# Patient Record
Sex: Female | Born: 1950 | Race: White | Hispanic: No | Smoking: Never smoker
Health system: Southern US, Community
[De-identification: ages and names within clinical notes are randomized; demographics above are authoritative.]

## PROBLEM LIST (undated history)

## (undated) DIAGNOSIS — Z1211 Encounter for screening for malignant neoplasm of colon: Secondary | ICD-10-CM

## (undated) DIAGNOSIS — M810 Age-related osteoporosis without current pathological fracture: Secondary | ICD-10-CM

## (undated) DIAGNOSIS — Z87442 Personal history of urinary calculi: Secondary | ICD-10-CM

## (undated) DIAGNOSIS — I00 Rheumatic fever without heart involvement: Secondary | ICD-10-CM

## (undated) DIAGNOSIS — E7800 Pure hypercholesterolemia, unspecified: Secondary | ICD-10-CM

## (undated) DIAGNOSIS — R42 Dizziness and giddiness: Secondary | ICD-10-CM

## (undated) DIAGNOSIS — K219 Gastro-esophageal reflux disease without esophagitis: Secondary | ICD-10-CM

## (undated) DIAGNOSIS — K4090 Unilateral inguinal hernia, without obstruction or gangrene, not specified as recurrent: Secondary | ICD-10-CM

## (undated) HISTORY — DX: Dizziness and giddiness: R42

## (undated) HISTORY — DX: Encounter for screening for malignant neoplasm of colon: Z12.11

## (undated) HISTORY — DX: Unilateral inguinal hernia, without obstruction or gangrene, not specified as recurrent: K40.90

## (undated) HISTORY — DX: Rheumatic fever without heart involvement: I00

## (undated) HISTORY — PX: COLONOSCOPY: SHX174

## (undated) HISTORY — DX: Gastro-esophageal reflux disease without esophagitis: K21.9

## (undated) HISTORY — DX: Personal history of urinary calculi: Z87.442

## (undated) HISTORY — DX: Pure hypercholesterolemia, unspecified: E78.00

---
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
## 1979-04-22 HISTORY — PX: LAPAROSCOPY: SHX197

## 2016-11-19 DIAGNOSIS — K4090 Unilateral inguinal hernia, without obstruction or gangrene, not specified as recurrent: Secondary | ICD-10-CM

## 2016-11-19 HISTORY — DX: Unilateral inguinal hernia, without obstruction or gangrene, not specified as recurrent: K40.90

## 2016-12-16 ENCOUNTER — Ambulatory Visit (INDEPENDENT_AMBULATORY_CARE_PROVIDER_SITE_OTHER): Payer: BLUE CROSS/BLUE SHIELD | Admitting: Family Medicine

## 2016-12-16 ENCOUNTER — Encounter: Payer: Self-pay | Admitting: Family Medicine

## 2016-12-16 VITALS — BP 97/59 | HR 82 | Temp 98.3°F | Resp 16 | Ht 64.25 in | Wt 110.2 lb

## 2016-12-16 DIAGNOSIS — R196 Halitosis: Secondary | ICD-10-CM

## 2016-12-16 DIAGNOSIS — K4090 Unilateral inguinal hernia, without obstruction or gangrene, not specified as recurrent: Secondary | ICD-10-CM | POA: Diagnosis not present

## 2016-12-16 NOTE — Progress Notes (Signed)
Office Note 12/16/2016  CC:  Chief Complaint  Patient presents with  . Establish Care    Previous PCP: [facility] in [place]  . Hernia    LLQ?  [name] Bad Breath    HPI:  Katherine Villanueva is a 66 y.o. female who is here to establish care, has question of hernia, wants to discuss bad breath. Patient's most recent primary MD: [facility] in [place]. Old records were not reviewed prior to or during today's visit.  Takes multiple supplements: alfalfa for sinus issues.  cataplex GTF for carb digestion, risveratol, horse chestnut for leg circulation/varicose veins, Zypan for health digestion.  Chezyn for thyroid/immune.  Copper liver chelate--for low copper. MVI with iron.  Detoxadine for iodine supplement.  Fish oil.  Vit D.  She feels a lump on and off in LLQ.  No pain or discomfort.  She began to notice in last few months after lifting a lot when moving from [place].  BMs regular, no blood or melena.  Appetite is good.  No abnormal wt loss.  Some purposeful wt loss the last 1 yr.  She also c/o bad breath last 2-3 weeks mostly.  Her husband has noted it and told her to ask me about it. She denies any dental issues, denies feeling heartburn/gerd sx's lately.  As noted above, she is on quite a few otc herbal/vit remedies.  Past Medical History:  Diagnosis Date  . GERD (gastroesophageal reflux disease)   . Rheumatic fever     Past Surgical History:  Procedure Laterality Date  . LAPAROSCOPY  1981   "for possible infertility"  . TONSILLECTOMY  1960    Family History  Problem Relation Age of Onset  . Arthritis Mother   . Stroke Mother   . Kidney disease Mother   . Hyperlipidemia Father   . Heart disease Father   . Diabetes Father   . Stroke Brother   . Galactosemia Son   . Lymphoma Paternal Grandmother   . Diabetes Paternal Grandfather     Social History   Social History  . Marital status: Married    Spouse name: N/A  . Number of children: N/A  . Years of  education: N/A   Occupational History  . Not on file.   Social History Main Topics  . Smoking status: Never Smoker  . Smokeless tobacco: Never Used  . Alcohol use 2.4 oz/week    4 Glasses of wine per week  . Drug use: No  . Sexual activity: Not on file   Other Topics Concern  . Not on file   Social History Narrative   Married, 1 daughter and one son.   Educ: nursing school.   Occup: retired--hx of LPN work, sign Engineer, technical sales.  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   Moved from [place] 06/2016.   Alc: 4 glasses red wine per week.   No tobacco or drugs.    No Known Allergies  ROS Review of Systems  Constitutional: Negative for fatigue and fever.  HENT: Negative for congestion and sore throat.   Eyes: Negative for visual disturbance.  Respiratory: Negative for cough.   Cardiovascular: Negative for chest pain.  Gastrointestinal: Negative for abdominal pain and nausea.  Genitourinary: Negative for dysuria.  Musculoskeletal: Negative for back pain and joint swelling.  Skin: Negative for rash.  Neurological: Negative for weakness and headaches.  Hematological: Negative for adenopathy.    PE; Blood pressure (!) 97/59, pulse 82, temperature 98.3 F (36.8 C), temperature source Oral, resp.  rate 16, height 5' 4.25" (1.632 m), weight 110 lb 4 oz (50 kg), SpO2 99 %. Pt examined with Joellyn Haff, RN, as chaperone. Gen: Alert, well appearing.  Patient is oriented to person, place, time, and situation. AFFECT: pleasant, lucid thought and speech. AYT:KZSW: no injection, icteris, swelling, or exudate.  EOMI, PERRLA. Mouth: lips without lesion/swelling.  Oral mucosa pink and moist. Oropharynx without erythema, exudate, or swelling.  CV: RRR, soft systolic murmur, no r/g.   LUNGS: CTA bilat, nonlabored resps, good aeration in all lung fields. ABD: soft, NT/ND, +pulsatile abd aorta w/out bruit.  No mass or HSM.  BS normal.  L groin with small palpable nontender hernia, reducible.  Pertinent  labs:  none  ASSESSMENT AND PLAN:   New pt; will try to obtain prior PCP records.  1) Left inguinal hernia: small and asymptomatic. Reassurance given. Watchful waiting approach at this time. Pt in agreement with this plan. Signs/symptoms to call or return for were reviewed and pt expressed understanding.  2) Halitosis: unclear etiology---relative dehydration vs silent GER vs effect from all her herbal/vit supplements. Discussed strategies to try to see which one of these it may be.  An After Visit Summary was printed and given to the patient.  Return in about 3 months (around 03/18/2017) for annual CPE (fasting).  Signed:  Santiago Bumpers, MD           12/16/2016

## 2016-12-24 DIAGNOSIS — M9901 Segmental and somatic dysfunction of cervical region: Secondary | ICD-10-CM | POA: Diagnosis not present

## 2016-12-24 DIAGNOSIS — M9903 Segmental and somatic dysfunction of lumbar region: Secondary | ICD-10-CM | POA: Diagnosis not present

## 2016-12-24 DIAGNOSIS — M9904 Segmental and somatic dysfunction of sacral region: Secondary | ICD-10-CM | POA: Diagnosis not present

## 2016-12-24 DIAGNOSIS — M9902 Segmental and somatic dysfunction of thoracic region: Secondary | ICD-10-CM | POA: Diagnosis not present

## 2016-12-30 DIAGNOSIS — M9902 Segmental and somatic dysfunction of thoracic region: Secondary | ICD-10-CM | POA: Diagnosis not present

## 2016-12-30 DIAGNOSIS — M9901 Segmental and somatic dysfunction of cervical region: Secondary | ICD-10-CM | POA: Diagnosis not present

## 2016-12-30 DIAGNOSIS — M9904 Segmental and somatic dysfunction of sacral region: Secondary | ICD-10-CM | POA: Diagnosis not present

## 2016-12-30 DIAGNOSIS — M9903 Segmental and somatic dysfunction of lumbar region: Secondary | ICD-10-CM | POA: Diagnosis not present

## 2017-01-01 DIAGNOSIS — M9901 Segmental and somatic dysfunction of cervical region: Secondary | ICD-10-CM | POA: Diagnosis not present

## 2017-01-01 DIAGNOSIS — M9904 Segmental and somatic dysfunction of sacral region: Secondary | ICD-10-CM | POA: Diagnosis not present

## 2017-01-01 DIAGNOSIS — M9902 Segmental and somatic dysfunction of thoracic region: Secondary | ICD-10-CM | POA: Diagnosis not present

## 2017-01-01 DIAGNOSIS — M9903 Segmental and somatic dysfunction of lumbar region: Secondary | ICD-10-CM | POA: Diagnosis not present

## 2017-01-06 DIAGNOSIS — M9902 Segmental and somatic dysfunction of thoracic region: Secondary | ICD-10-CM | POA: Diagnosis not present

## 2017-01-06 DIAGNOSIS — M9903 Segmental and somatic dysfunction of lumbar region: Secondary | ICD-10-CM | POA: Diagnosis not present

## 2017-01-06 DIAGNOSIS — M9904 Segmental and somatic dysfunction of sacral region: Secondary | ICD-10-CM | POA: Diagnosis not present

## 2017-01-06 DIAGNOSIS — M9901 Segmental and somatic dysfunction of cervical region: Secondary | ICD-10-CM | POA: Diagnosis not present

## 2017-01-21 DIAGNOSIS — M9903 Segmental and somatic dysfunction of lumbar region: Secondary | ICD-10-CM | POA: Diagnosis not present

## 2017-01-21 DIAGNOSIS — M9901 Segmental and somatic dysfunction of cervical region: Secondary | ICD-10-CM | POA: Diagnosis not present

## 2017-01-21 DIAGNOSIS — M9904 Segmental and somatic dysfunction of sacral region: Secondary | ICD-10-CM | POA: Diagnosis not present

## 2017-01-21 DIAGNOSIS — M9902 Segmental and somatic dysfunction of thoracic region: Secondary | ICD-10-CM | POA: Diagnosis not present

## 2017-01-28 DIAGNOSIS — M9902 Segmental and somatic dysfunction of thoracic region: Secondary | ICD-10-CM | POA: Diagnosis not present

## 2017-01-28 DIAGNOSIS — M9901 Segmental and somatic dysfunction of cervical region: Secondary | ICD-10-CM | POA: Diagnosis not present

## 2017-01-28 DIAGNOSIS — M9903 Segmental and somatic dysfunction of lumbar region: Secondary | ICD-10-CM | POA: Diagnosis not present

## 2017-01-28 DIAGNOSIS — M9904 Segmental and somatic dysfunction of sacral region: Secondary | ICD-10-CM | POA: Diagnosis not present

## 2017-02-04 DIAGNOSIS — M9903 Segmental and somatic dysfunction of lumbar region: Secondary | ICD-10-CM | POA: Diagnosis not present

## 2017-02-04 DIAGNOSIS — M9902 Segmental and somatic dysfunction of thoracic region: Secondary | ICD-10-CM | POA: Diagnosis not present

## 2017-02-04 DIAGNOSIS — M9901 Segmental and somatic dysfunction of cervical region: Secondary | ICD-10-CM | POA: Diagnosis not present

## 2017-02-04 DIAGNOSIS — M9904 Segmental and somatic dysfunction of sacral region: Secondary | ICD-10-CM | POA: Diagnosis not present

## 2017-02-11 DIAGNOSIS — M9904 Segmental and somatic dysfunction of sacral region: Secondary | ICD-10-CM | POA: Diagnosis not present

## 2017-02-11 DIAGNOSIS — M9902 Segmental and somatic dysfunction of thoracic region: Secondary | ICD-10-CM | POA: Diagnosis not present

## 2017-02-11 DIAGNOSIS — M9903 Segmental and somatic dysfunction of lumbar region: Secondary | ICD-10-CM | POA: Diagnosis not present

## 2017-02-11 DIAGNOSIS — M9901 Segmental and somatic dysfunction of cervical region: Secondary | ICD-10-CM | POA: Diagnosis not present

## 2017-02-18 DIAGNOSIS — M9903 Segmental and somatic dysfunction of lumbar region: Secondary | ICD-10-CM | POA: Diagnosis not present

## 2017-02-18 DIAGNOSIS — M9901 Segmental and somatic dysfunction of cervical region: Secondary | ICD-10-CM | POA: Diagnosis not present

## 2017-02-18 DIAGNOSIS — M9904 Segmental and somatic dysfunction of sacral region: Secondary | ICD-10-CM | POA: Diagnosis not present

## 2017-02-18 DIAGNOSIS — M9902 Segmental and somatic dysfunction of thoracic region: Secondary | ICD-10-CM | POA: Diagnosis not present

## 2017-02-19 DIAGNOSIS — E7800 Pure hypercholesterolemia, unspecified: Secondary | ICD-10-CM

## 2017-02-19 HISTORY — DX: Pure hypercholesterolemia, unspecified: E78.00

## 2017-03-04 DIAGNOSIS — M9903 Segmental and somatic dysfunction of lumbar region: Secondary | ICD-10-CM | POA: Diagnosis not present

## 2017-03-04 DIAGNOSIS — M9904 Segmental and somatic dysfunction of sacral region: Secondary | ICD-10-CM | POA: Diagnosis not present

## 2017-03-04 DIAGNOSIS — M9902 Segmental and somatic dysfunction of thoracic region: Secondary | ICD-10-CM | POA: Diagnosis not present

## 2017-03-04 DIAGNOSIS — M9901 Segmental and somatic dysfunction of cervical region: Secondary | ICD-10-CM | POA: Diagnosis not present

## 2017-03-18 ENCOUNTER — Encounter: Payer: Self-pay | Admitting: Family Medicine

## 2017-03-18 ENCOUNTER — Other Ambulatory Visit: Payer: Self-pay

## 2017-03-18 ENCOUNTER — Ambulatory Visit (INDEPENDENT_AMBULATORY_CARE_PROVIDER_SITE_OTHER): Payer: BLUE CROSS/BLUE SHIELD | Admitting: Family Medicine

## 2017-03-18 ENCOUNTER — Other Ambulatory Visit (HOSPITAL_COMMUNITY)
Admission: RE | Admit: 2017-03-18 | Discharge: 2017-03-18 | Disposition: A | Discharge status: Home / Self Care | Payer: BLUE CROSS/BLUE SHIELD | Source: Ambulatory Visit | Attending: Family Medicine | Admitting: Family Medicine

## 2017-03-18 VITALS — BP 106/57 | HR 70 | Temp 98.1°F | Resp 16 | Ht 64.25 in | Wt 111.2 lb

## 2017-03-18 DIAGNOSIS — M9901 Segmental and somatic dysfunction of cervical region: Secondary | ICD-10-CM | POA: Diagnosis not present

## 2017-03-18 DIAGNOSIS — M9903 Segmental and somatic dysfunction of lumbar region: Secondary | ICD-10-CM | POA: Diagnosis not present

## 2017-03-18 DIAGNOSIS — Z1239 Encounter for other screening for malignant neoplasm of breast: Secondary | ICD-10-CM

## 2017-03-18 DIAGNOSIS — Z0000 Encounter for general adult medical examination without abnormal findings: Secondary | ICD-10-CM

## 2017-03-18 DIAGNOSIS — M9902 Segmental and somatic dysfunction of thoracic region: Secondary | ICD-10-CM | POA: Diagnosis not present

## 2017-03-18 DIAGNOSIS — Z124 Encounter for screening for malignant neoplasm of cervix: Secondary | ICD-10-CM

## 2017-03-18 DIAGNOSIS — Z1231 Encounter for screening mammogram for malignant neoplasm of breast: Secondary | ICD-10-CM

## 2017-03-18 DIAGNOSIS — M9904 Segmental and somatic dysfunction of sacral region: Secondary | ICD-10-CM | POA: Diagnosis not present

## 2017-03-18 LAB — CBC WITH DIFFERENTIAL/PLATELET
Basophils Absolute: 0.1 10*3/uL (ref 0.0–0.1)
Basophils Relative: 0.8 % (ref 0.0–3.0)
Eosinophils Absolute: 0.1 10*3/uL (ref 0.0–0.7)
Eosinophils Relative: 1 % (ref 0.0–5.0)
HCT: 41 % (ref 36.0–46.0)
Hemoglobin: 13.5 g/dL (ref 12.0–15.0)
Lymphocytes Relative: 31.1 % (ref 12.0–46.0)
Lymphs Abs: 2.3 10*3/uL (ref 0.7–4.0)
MCHC: 33.1 g/dL (ref 30.0–36.0)
MCV: 97.2 fl (ref 78.0–100.0)
Monocytes Absolute: 0.6 10*3/uL (ref 0.1–1.0)
Monocytes Relative: 7.4 % (ref 3.0–12.0)
Neutro Abs: 4.5 10*3/uL (ref 1.4–7.7)
Neutrophils Relative %: 59.7 % (ref 43.0–77.0)
Platelets: 273 10*3/uL (ref 150.0–400.0)
RBC: 4.21 Mil/uL (ref 3.87–5.11)
RDW: 13.7 % (ref 11.5–15.5)
WBC: 7.5 10*3/uL (ref 4.0–10.5)

## 2017-03-18 LAB — COMPREHENSIVE METABOLIC PANEL
ALT: 29 U/L (ref 0–35)
AST: 26 U/L (ref 0–37)
Albumin: 4.5 g/dL (ref 3.5–5.2)
Alkaline Phosphatase: 58 U/L (ref 39–117)
BUN: 19 mg/dL (ref 6–23)
CO2: 30 mEq/L (ref 19–32)
Calcium: 9.5 mg/dL (ref 8.4–10.5)
Chloride: 103 mEq/L (ref 96–112)
Creatinine, Ser: 0.67 mg/dL (ref 0.40–1.20)
GFR: 93.94 mL/min (ref >60.00)
Glucose, Bld: 96 mg/dL (ref 70–99)
Potassium: 4 mEq/L (ref 3.5–5.1)
Sodium: 140 mEq/L (ref 135–145)
Total Bilirubin: 1 mg/dL (ref 0.2–1.2)
Total Protein: 6.9 g/dL (ref 6.0–8.3)

## 2017-03-18 LAB — LIPID PANEL
Cholesterol: 318 mg/dL — ABNORMAL HIGH (ref 0–200)
HDL: 76.6 mg/dL (ref >39.00)
LDL Cholesterol: 232 mg/dL — ABNORMAL HIGH (ref 0–99)
NonHDL: 240.91
Total CHOL/HDL Ratio: 4
Triglycerides: 47 mg/dL (ref 0.0–149.0)
VLDL: 9.4 mg/dL (ref 0.0–40.0)

## 2017-03-18 LAB — TSH: TSH: 3.18 u[IU]/mL (ref 0.35–4.50)

## 2017-03-18 NOTE — Patient Instructions (Signed)

## 2017-03-18 NOTE — Progress Notes (Signed)
Office Note 03/18/2017  CC:  Chief Complaint  Patient presents with  . Annual Exam    Pt is fasting.    HPI:  Katherine Villanueva is a 66 y.o. female who is here for annual health maintenance exam. Pt pretty recently moved here from [place] and although we've requested old PCP records we have not received any.  Most recent pap: "quite some time ago".  No hx of abnormals.  She is postmenopausal. Mammogram: "quite some time ago".  Hx of cysts (20 yrs ago or so), frequent f/u in the past, then resumed annual mammos.  Past Medical History:  Diagnosis Date  . GERD (gastroesophageal reflux disease)   . Left inguinal hernia 11/2016   Watchful waiting approach  . Rheumatic fever     Past Surgical History:  Procedure Laterality Date  . COLONOSCOPY     X 2.  Most recent ? 20014?  No abnormals.  [name]. LAPAROSCOPY  1981   "for possible infertility"  . TONSILLECTOMY  1960    Family History  Problem Relation Age of Onset  . Arthritis Mother   . Stroke Mother   . Kidney disease Mother   . Hyperlipidemia Father   . Heart disease Father   . Diabetes Father   . Stroke Brother   . Galactosemia Son   . Lymphoma Paternal Grandmother   . Diabetes Paternal Grandfather     Social History   Socioeconomic History  . Marital status: Married    Spouse name: Not on file  . Number of children: Not on file  . Years of education: Not on file  . Highest education level: Not on file  Social Needs  . Financial resource strain: Not on file  . Food insecurity - worry: Not on file  . Food insecurity - inability: Not on file  . Transportation needs - medical: Not on file  . Transportation needs - non-medical: Not on file  Occupational History  . Not on file  Tobacco Use  . Smoking status: Never Smoker  . Smokeless tobacco: Never Used  Substance and Sexual Activity  . Alcohol use: Yes    Alcohol/week: 2.4 oz    Types: 4 Glasses of wine per week  . Drug use: No  . Sexual activity: Not on  file  Other Topics Concern  . Not on file  Social History Narrative   Married, 1 daughter and one son.   Educ: nursing school.   Occup: retired--hx of LPN work, sign Engineer, technical saleslanguage interpreter.  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   Moved from [place] 06/2016.   Alc: 4 glasses red wine per week.   No tobacco or drugs.    No outpatient medications prior to visit.   No facility-administered medications prior to visit.     No Known Allergies  ROS Review of Systems  Constitutional: Negative for appetite change, chills, fatigue and fever.  HENT: Negative for congestion, dental problem, ear pain and sore throat.   Eyes: Negative for discharge, redness and visual disturbance.  Respiratory: Negative for cough, chest tightness, shortness of breath and wheezing.   Cardiovascular: Negative for chest pain, palpitations and leg swelling.  Gastrointestinal: Negative for abdominal pain, blood in stool, diarrhea, nausea and vomiting.  Genitourinary: Negative for difficulty urinating, dysuria, flank pain, frequency, hematuria and urgency.  Musculoskeletal: Negative for arthralgias, back pain, joint swelling, myalgias and neck stiffness.  Skin: Negative for pallor and rash.  Neurological: Negative for dizziness, speech difficulty, weakness and headaches.  Hematological: Negative for adenopathy.  Does not bruise/bleed easily.  Psychiatric/Behavioral: Negative for confusion and sleep disturbance. The patient is not nervous/anxious.     PE; Blood pressure (!) 106/57, pulse 70, temperature 98.1 F (36.7 C), temperature source Oral, resp. rate 16, height 5' 4.25" (1.632 m), weight 111 lb 4 oz (50.5 kg), SpO2 100 %. Body mass index is 18.95 kg/m.  Examination chaperoned by Pryor OchoaHeather Sutherland, CMA.  Gen: Alert, well appearing.  Patient is oriented to person, place, time, and situation. AFFECT: pleasant, lucid thought and speech. ENT: Ears: EACs clear, normal epithelium.  TMs with good light reflex and landmarks  bilaterally.  Eyes: no injection, icteris, swelling, or exudate.  EOMI, PERRLA. Nose: no drainage or turbinate edema/swelling.  No injection or focal lesion.  Mouth: lips without lesion/swelling.  Oral mucosa pink and moist.  Dentition intact and without obvious caries or gingival swelling.  Oropharynx without erythema, exudate, or swelling.  Neck: supple/nontender.  No LAD, mass, or TM.  Carotid pulses 2+ bilaterally, without bruits. CV: RRR, no m/r/g.   LUNGS: CTA bilat, nonlabored resps, good aeration in all lung fields. ABD: soft, NT, ND, BS normal.  No hepatospenomegaly or mass.  No bruits.  Abdomen is flat, I can feel abd aortic pulsations readily but no mass/tenderness/bruit. EXT: no clubbing, cyanosis, or edema.  Musculoskeletal: no joint swelling, erythema, warmth, or tenderness.  ROM of all joints intact. Skin - no sores or suspicious lesions or rashes or color changes Vagina and vulva are normal;  no discharge is noted.  Cervix normal without lesions. Uterus anteverted and mobile, normal in size and shape without tenderness.  Adnexa normal in size without masses or tenderness. Pap Smear - is completed today. Exam chaperoned by female assistant.   Pertinent labs:  none  ASSESSMENT AND PLAN:   Health maintenance exam: Reviewed age and gender appropriate health maintenance issues (prudent diet, regular exercise, health risks of tobacco and excessive alcohol, use of seatbelts, fire alarms in home, use of sunscreen).  Also reviewed age and gender appropriate health screening as well as vaccine recommendations. Vaccines: she declined Tdap and flu vaccines today. Labs: fasting HP today.  She declined HIV.  She'll decline Hep C for now while she checks on coverage with her insurer. Breast ca screening: mammogram ordered today. Cervical Ca screening: pap done today.  Pelvic normal. Colon ca screening: repeat approx 2024--will clarify when old records obtained.  An After Visit Summary was  printed and given to the patient.  FOLLOW UP:  Return in about 1 year (around 03/18/2018) for annual CPE (fasting).  Signed:  Santiago BumpersPhil Dimitri Shakespeare, MD           03/18/2017

## 2017-03-19 ENCOUNTER — Other Ambulatory Visit: Payer: Self-pay | Admitting: *Deleted

## 2017-03-19 DIAGNOSIS — E7800 Pure hypercholesterolemia, unspecified: Secondary | ICD-10-CM

## 2017-03-20 LAB — CYTOLOGY - PAP
Diagnosis: NEGATIVE
HPV: NOT DETECTED

## 2017-03-23 DIAGNOSIS — M9903 Segmental and somatic dysfunction of lumbar region: Secondary | ICD-10-CM | POA: Diagnosis not present

## 2017-03-23 DIAGNOSIS — M9901 Segmental and somatic dysfunction of cervical region: Secondary | ICD-10-CM | POA: Diagnosis not present

## 2017-03-23 DIAGNOSIS — M9902 Segmental and somatic dysfunction of thoracic region: Secondary | ICD-10-CM | POA: Diagnosis not present

## 2017-03-23 DIAGNOSIS — M9904 Segmental and somatic dysfunction of sacral region: Secondary | ICD-10-CM | POA: Diagnosis not present

## 2017-03-26 DIAGNOSIS — M9901 Segmental and somatic dysfunction of cervical region: Secondary | ICD-10-CM | POA: Diagnosis not present

## 2017-03-26 DIAGNOSIS — M9904 Segmental and somatic dysfunction of sacral region: Secondary | ICD-10-CM | POA: Diagnosis not present

## 2017-03-26 DIAGNOSIS — M9902 Segmental and somatic dysfunction of thoracic region: Secondary | ICD-10-CM | POA: Diagnosis not present

## 2017-03-26 DIAGNOSIS — M9903 Segmental and somatic dysfunction of lumbar region: Secondary | ICD-10-CM | POA: Diagnosis not present

## 2017-04-01 DIAGNOSIS — M9902 Segmental and somatic dysfunction of thoracic region: Secondary | ICD-10-CM | POA: Diagnosis not present

## 2017-04-01 DIAGNOSIS — M9903 Segmental and somatic dysfunction of lumbar region: Secondary | ICD-10-CM | POA: Diagnosis not present

## 2017-04-01 DIAGNOSIS — M9904 Segmental and somatic dysfunction of sacral region: Secondary | ICD-10-CM | POA: Diagnosis not present

## 2017-04-01 DIAGNOSIS — M9901 Segmental and somatic dysfunction of cervical region: Secondary | ICD-10-CM | POA: Diagnosis not present

## 2017-04-06 DIAGNOSIS — M9904 Segmental and somatic dysfunction of sacral region: Secondary | ICD-10-CM | POA: Diagnosis not present

## 2017-04-06 DIAGNOSIS — M9903 Segmental and somatic dysfunction of lumbar region: Secondary | ICD-10-CM | POA: Diagnosis not present

## 2017-04-06 DIAGNOSIS — M9901 Segmental and somatic dysfunction of cervical region: Secondary | ICD-10-CM | POA: Diagnosis not present

## 2017-04-06 DIAGNOSIS — M9902 Segmental and somatic dysfunction of thoracic region: Secondary | ICD-10-CM | POA: Diagnosis not present

## 2017-04-08 DIAGNOSIS — M9904 Segmental and somatic dysfunction of sacral region: Secondary | ICD-10-CM | POA: Diagnosis not present

## 2017-04-08 DIAGNOSIS — M9903 Segmental and somatic dysfunction of lumbar region: Secondary | ICD-10-CM | POA: Diagnosis not present

## 2017-04-08 DIAGNOSIS — M9902 Segmental and somatic dysfunction of thoracic region: Secondary | ICD-10-CM | POA: Diagnosis not present

## 2017-04-08 DIAGNOSIS — M9901 Segmental and somatic dysfunction of cervical region: Secondary | ICD-10-CM | POA: Diagnosis not present

## 2017-04-22 DIAGNOSIS — M9903 Segmental and somatic dysfunction of lumbar region: Secondary | ICD-10-CM | POA: Diagnosis not present

## 2017-04-22 DIAGNOSIS — M9902 Segmental and somatic dysfunction of thoracic region: Secondary | ICD-10-CM | POA: Diagnosis not present

## 2017-04-22 DIAGNOSIS — M9904 Segmental and somatic dysfunction of sacral region: Secondary | ICD-10-CM | POA: Diagnosis not present

## 2017-04-22 DIAGNOSIS — M9901 Segmental and somatic dysfunction of cervical region: Secondary | ICD-10-CM | POA: Diagnosis not present

## 2017-04-27 ENCOUNTER — Ambulatory Visit: Payer: BLUE CROSS/BLUE SHIELD

## 2017-04-27 DIAGNOSIS — M9902 Segmental and somatic dysfunction of thoracic region: Secondary | ICD-10-CM | POA: Diagnosis not present

## 2017-04-27 DIAGNOSIS — M9901 Segmental and somatic dysfunction of cervical region: Secondary | ICD-10-CM | POA: Diagnosis not present

## 2017-04-27 DIAGNOSIS — M9903 Segmental and somatic dysfunction of lumbar region: Secondary | ICD-10-CM | POA: Diagnosis not present

## 2017-04-27 DIAGNOSIS — M9904 Segmental and somatic dysfunction of sacral region: Secondary | ICD-10-CM | POA: Diagnosis not present

## 2017-04-28 ENCOUNTER — Other Ambulatory Visit: Payer: BLUE CROSS/BLUE SHIELD

## 2017-04-29 DIAGNOSIS — M9903 Segmental and somatic dysfunction of lumbar region: Secondary | ICD-10-CM | POA: Diagnosis not present

## 2017-04-29 DIAGNOSIS — M9904 Segmental and somatic dysfunction of sacral region: Secondary | ICD-10-CM | POA: Diagnosis not present

## 2017-04-29 DIAGNOSIS — M9901 Segmental and somatic dysfunction of cervical region: Secondary | ICD-10-CM | POA: Diagnosis not present

## 2017-04-29 DIAGNOSIS — M9902 Segmental and somatic dysfunction of thoracic region: Secondary | ICD-10-CM | POA: Diagnosis not present

## 2017-05-04 DIAGNOSIS — M9904 Segmental and somatic dysfunction of sacral region: Secondary | ICD-10-CM | POA: Diagnosis not present

## 2017-05-04 DIAGNOSIS — M9901 Segmental and somatic dysfunction of cervical region: Secondary | ICD-10-CM | POA: Diagnosis not present

## 2017-05-04 DIAGNOSIS — M9903 Segmental and somatic dysfunction of lumbar region: Secondary | ICD-10-CM | POA: Diagnosis not present

## 2017-05-04 DIAGNOSIS — M9902 Segmental and somatic dysfunction of thoracic region: Secondary | ICD-10-CM | POA: Diagnosis not present

## 2017-05-07 DIAGNOSIS — M9904 Segmental and somatic dysfunction of sacral region: Secondary | ICD-10-CM | POA: Diagnosis not present

## 2017-05-07 DIAGNOSIS — M9903 Segmental and somatic dysfunction of lumbar region: Secondary | ICD-10-CM | POA: Diagnosis not present

## 2017-05-07 DIAGNOSIS — M9901 Segmental and somatic dysfunction of cervical region: Secondary | ICD-10-CM | POA: Diagnosis not present

## 2017-05-07 DIAGNOSIS — M9902 Segmental and somatic dysfunction of thoracic region: Secondary | ICD-10-CM | POA: Diagnosis not present

## 2017-05-11 DIAGNOSIS — M9901 Segmental and somatic dysfunction of cervical region: Secondary | ICD-10-CM | POA: Diagnosis not present

## 2017-05-11 DIAGNOSIS — M9902 Segmental and somatic dysfunction of thoracic region: Secondary | ICD-10-CM | POA: Diagnosis not present

## 2017-05-11 DIAGNOSIS — M9903 Segmental and somatic dysfunction of lumbar region: Secondary | ICD-10-CM | POA: Diagnosis not present

## 2017-05-11 DIAGNOSIS — M9904 Segmental and somatic dysfunction of sacral region: Secondary | ICD-10-CM | POA: Diagnosis not present

## 2017-05-14 DIAGNOSIS — M9904 Segmental and somatic dysfunction of sacral region: Secondary | ICD-10-CM | POA: Diagnosis not present

## 2017-05-14 DIAGNOSIS — M9902 Segmental and somatic dysfunction of thoracic region: Secondary | ICD-10-CM | POA: Diagnosis not present

## 2017-05-14 DIAGNOSIS — M9903 Segmental and somatic dysfunction of lumbar region: Secondary | ICD-10-CM | POA: Diagnosis not present

## 2017-05-14 DIAGNOSIS — M9901 Segmental and somatic dysfunction of cervical region: Secondary | ICD-10-CM | POA: Diagnosis not present

## 2017-05-19 DIAGNOSIS — M9904 Segmental and somatic dysfunction of sacral region: Secondary | ICD-10-CM | POA: Diagnosis not present

## 2017-05-19 DIAGNOSIS — M9903 Segmental and somatic dysfunction of lumbar region: Secondary | ICD-10-CM | POA: Diagnosis not present

## 2017-05-19 DIAGNOSIS — M9901 Segmental and somatic dysfunction of cervical region: Secondary | ICD-10-CM | POA: Diagnosis not present

## 2017-05-19 DIAGNOSIS — M9902 Segmental and somatic dysfunction of thoracic region: Secondary | ICD-10-CM | POA: Diagnosis not present

## 2017-05-21 DIAGNOSIS — M9904 Segmental and somatic dysfunction of sacral region: Secondary | ICD-10-CM | POA: Diagnosis not present

## 2017-05-21 DIAGNOSIS — M9902 Segmental and somatic dysfunction of thoracic region: Secondary | ICD-10-CM | POA: Diagnosis not present

## 2017-05-21 DIAGNOSIS — M9901 Segmental and somatic dysfunction of cervical region: Secondary | ICD-10-CM | POA: Diagnosis not present

## 2017-05-21 DIAGNOSIS — M9903 Segmental and somatic dysfunction of lumbar region: Secondary | ICD-10-CM | POA: Diagnosis not present

## 2017-05-26 DIAGNOSIS — M9901 Segmental and somatic dysfunction of cervical region: Secondary | ICD-10-CM | POA: Diagnosis not present

## 2017-05-26 DIAGNOSIS — M9904 Segmental and somatic dysfunction of sacral region: Secondary | ICD-10-CM | POA: Diagnosis not present

## 2017-05-26 DIAGNOSIS — M9903 Segmental and somatic dysfunction of lumbar region: Secondary | ICD-10-CM | POA: Diagnosis not present

## 2017-05-26 DIAGNOSIS — M9902 Segmental and somatic dysfunction of thoracic region: Secondary | ICD-10-CM | POA: Diagnosis not present

## 2017-05-28 DIAGNOSIS — M9901 Segmental and somatic dysfunction of cervical region: Secondary | ICD-10-CM | POA: Diagnosis not present

## 2017-05-28 DIAGNOSIS — M9902 Segmental and somatic dysfunction of thoracic region: Secondary | ICD-10-CM | POA: Diagnosis not present

## 2017-05-28 DIAGNOSIS — M9904 Segmental and somatic dysfunction of sacral region: Secondary | ICD-10-CM | POA: Diagnosis not present

## 2017-05-28 DIAGNOSIS — M9903 Segmental and somatic dysfunction of lumbar region: Secondary | ICD-10-CM | POA: Diagnosis not present

## 2017-05-29 ENCOUNTER — Other Ambulatory Visit (INDEPENDENT_AMBULATORY_CARE_PROVIDER_SITE_OTHER): Payer: BLUE CROSS/BLUE SHIELD

## 2017-05-29 DIAGNOSIS — E7800 Pure hypercholesterolemia, unspecified: Secondary | ICD-10-CM | POA: Diagnosis not present

## 2017-05-29 LAB — LIPID PANEL
Cholesterol: 211 mg/dL — ABNORMAL HIGH (ref 0–200)
HDL: 71.6 mg/dL (ref >39.00)
LDL Cholesterol: 130 mg/dL — ABNORMAL HIGH (ref 0–99)
NonHDL: 139.87
Total CHOL/HDL Ratio: 3
Triglycerides: 47 mg/dL (ref 0.0–149.0)
VLDL: 9.4 mg/dL (ref 0.0–40.0)

## 2017-06-03 ENCOUNTER — Encounter: Payer: Self-pay | Admitting: Family Medicine

## 2017-06-03 DIAGNOSIS — M9901 Segmental and somatic dysfunction of cervical region: Secondary | ICD-10-CM | POA: Diagnosis not present

## 2017-06-03 DIAGNOSIS — M9904 Segmental and somatic dysfunction of sacral region: Secondary | ICD-10-CM | POA: Diagnosis not present

## 2017-06-03 DIAGNOSIS — M9902 Segmental and somatic dysfunction of thoracic region: Secondary | ICD-10-CM | POA: Diagnosis not present

## 2017-06-03 DIAGNOSIS — M9903 Segmental and somatic dysfunction of lumbar region: Secondary | ICD-10-CM | POA: Diagnosis not present

## 2017-06-10 DIAGNOSIS — M9902 Segmental and somatic dysfunction of thoracic region: Secondary | ICD-10-CM | POA: Diagnosis not present

## 2017-06-10 DIAGNOSIS — M9903 Segmental and somatic dysfunction of lumbar region: Secondary | ICD-10-CM | POA: Diagnosis not present

## 2017-06-10 DIAGNOSIS — M9904 Segmental and somatic dysfunction of sacral region: Secondary | ICD-10-CM | POA: Diagnosis not present

## 2017-06-10 DIAGNOSIS — M9901 Segmental and somatic dysfunction of cervical region: Secondary | ICD-10-CM | POA: Diagnosis not present

## 2017-06-17 DIAGNOSIS — M9901 Segmental and somatic dysfunction of cervical region: Secondary | ICD-10-CM | POA: Diagnosis not present

## 2017-06-17 DIAGNOSIS — M9904 Segmental and somatic dysfunction of sacral region: Secondary | ICD-10-CM | POA: Diagnosis not present

## 2017-06-17 DIAGNOSIS — M9903 Segmental and somatic dysfunction of lumbar region: Secondary | ICD-10-CM | POA: Diagnosis not present

## 2017-06-17 DIAGNOSIS — M9902 Segmental and somatic dysfunction of thoracic region: Secondary | ICD-10-CM | POA: Diagnosis not present

## 2017-06-24 DIAGNOSIS — M9902 Segmental and somatic dysfunction of thoracic region: Secondary | ICD-10-CM | POA: Diagnosis not present

## 2017-06-24 DIAGNOSIS — M9903 Segmental and somatic dysfunction of lumbar region: Secondary | ICD-10-CM | POA: Diagnosis not present

## 2017-06-24 DIAGNOSIS — M9904 Segmental and somatic dysfunction of sacral region: Secondary | ICD-10-CM | POA: Diagnosis not present

## 2017-06-24 DIAGNOSIS — M9901 Segmental and somatic dysfunction of cervical region: Secondary | ICD-10-CM | POA: Diagnosis not present

## 2017-06-29 DIAGNOSIS — M9901 Segmental and somatic dysfunction of cervical region: Secondary | ICD-10-CM | POA: Diagnosis not present

## 2017-06-29 DIAGNOSIS — M9903 Segmental and somatic dysfunction of lumbar region: Secondary | ICD-10-CM | POA: Diagnosis not present

## 2017-06-29 DIAGNOSIS — M9902 Segmental and somatic dysfunction of thoracic region: Secondary | ICD-10-CM | POA: Diagnosis not present

## 2017-06-29 DIAGNOSIS — M9904 Segmental and somatic dysfunction of sacral region: Secondary | ICD-10-CM | POA: Diagnosis not present

## 2017-07-13 DIAGNOSIS — M9903 Segmental and somatic dysfunction of lumbar region: Secondary | ICD-10-CM | POA: Diagnosis not present

## 2017-07-13 DIAGNOSIS — M9902 Segmental and somatic dysfunction of thoracic region: Secondary | ICD-10-CM | POA: Diagnosis not present

## 2017-07-13 DIAGNOSIS — M9904 Segmental and somatic dysfunction of sacral region: Secondary | ICD-10-CM | POA: Diagnosis not present

## 2017-07-13 DIAGNOSIS — M9901 Segmental and somatic dysfunction of cervical region: Secondary | ICD-10-CM | POA: Diagnosis not present

## 2017-08-03 DIAGNOSIS — M9901 Segmental and somatic dysfunction of cervical region: Secondary | ICD-10-CM | POA: Diagnosis not present

## 2017-08-03 DIAGNOSIS — M9903 Segmental and somatic dysfunction of lumbar region: Secondary | ICD-10-CM | POA: Diagnosis not present

## 2017-08-03 DIAGNOSIS — M9902 Segmental and somatic dysfunction of thoracic region: Secondary | ICD-10-CM | POA: Diagnosis not present

## 2017-08-03 DIAGNOSIS — M9904 Segmental and somatic dysfunction of sacral region: Secondary | ICD-10-CM | POA: Diagnosis not present

## 2017-08-31 DIAGNOSIS — M9901 Segmental and somatic dysfunction of cervical region: Secondary | ICD-10-CM | POA: Diagnosis not present

## 2017-08-31 DIAGNOSIS — M9904 Segmental and somatic dysfunction of sacral region: Secondary | ICD-10-CM | POA: Diagnosis not present

## 2017-08-31 DIAGNOSIS — M9902 Segmental and somatic dysfunction of thoracic region: Secondary | ICD-10-CM | POA: Diagnosis not present

## 2017-08-31 DIAGNOSIS — M9903 Segmental and somatic dysfunction of lumbar region: Secondary | ICD-10-CM | POA: Diagnosis not present

## 2017-09-28 DIAGNOSIS — M9902 Segmental and somatic dysfunction of thoracic region: Secondary | ICD-10-CM | POA: Diagnosis not present

## 2017-09-28 DIAGNOSIS — M9903 Segmental and somatic dysfunction of lumbar region: Secondary | ICD-10-CM | POA: Diagnosis not present

## 2017-09-28 DIAGNOSIS — M9904 Segmental and somatic dysfunction of sacral region: Secondary | ICD-10-CM | POA: Diagnosis not present

## 2017-09-28 DIAGNOSIS — M9901 Segmental and somatic dysfunction of cervical region: Secondary | ICD-10-CM | POA: Diagnosis not present

## 2017-10-26 DIAGNOSIS — M9904 Segmental and somatic dysfunction of sacral region: Secondary | ICD-10-CM | POA: Diagnosis not present

## 2017-10-26 DIAGNOSIS — M9903 Segmental and somatic dysfunction of lumbar region: Secondary | ICD-10-CM | POA: Diagnosis not present

## 2017-10-26 DIAGNOSIS — M9901 Segmental and somatic dysfunction of cervical region: Secondary | ICD-10-CM | POA: Diagnosis not present

## 2017-10-26 DIAGNOSIS — M9902 Segmental and somatic dysfunction of thoracic region: Secondary | ICD-10-CM | POA: Diagnosis not present

## 2017-11-19 DIAGNOSIS — M9903 Segmental and somatic dysfunction of lumbar region: Secondary | ICD-10-CM | POA: Diagnosis not present

## 2017-11-19 DIAGNOSIS — M9901 Segmental and somatic dysfunction of cervical region: Secondary | ICD-10-CM | POA: Diagnosis not present

## 2017-11-19 DIAGNOSIS — M9902 Segmental and somatic dysfunction of thoracic region: Secondary | ICD-10-CM | POA: Diagnosis not present

## 2017-11-19 DIAGNOSIS — M9904 Segmental and somatic dysfunction of sacral region: Secondary | ICD-10-CM | POA: Diagnosis not present

## 2017-12-17 DIAGNOSIS — M9902 Segmental and somatic dysfunction of thoracic region: Secondary | ICD-10-CM | POA: Diagnosis not present

## 2017-12-17 DIAGNOSIS — M9903 Segmental and somatic dysfunction of lumbar region: Secondary | ICD-10-CM | POA: Diagnosis not present

## 2017-12-17 DIAGNOSIS — M9904 Segmental and somatic dysfunction of sacral region: Secondary | ICD-10-CM | POA: Diagnosis not present

## 2017-12-17 DIAGNOSIS — M9901 Segmental and somatic dysfunction of cervical region: Secondary | ICD-10-CM | POA: Diagnosis not present

## 2018-01-20 DIAGNOSIS — M9904 Segmental and somatic dysfunction of sacral region: Secondary | ICD-10-CM | POA: Diagnosis not present

## 2018-01-20 DIAGNOSIS — M9902 Segmental and somatic dysfunction of thoracic region: Secondary | ICD-10-CM | POA: Diagnosis not present

## 2018-01-20 DIAGNOSIS — M9903 Segmental and somatic dysfunction of lumbar region: Secondary | ICD-10-CM | POA: Diagnosis not present

## 2018-01-20 DIAGNOSIS — M9901 Segmental and somatic dysfunction of cervical region: Secondary | ICD-10-CM | POA: Diagnosis not present

## 2018-02-17 DIAGNOSIS — M9904 Segmental and somatic dysfunction of sacral region: Secondary | ICD-10-CM | POA: Diagnosis not present

## 2018-02-17 DIAGNOSIS — M9903 Segmental and somatic dysfunction of lumbar region: Secondary | ICD-10-CM | POA: Diagnosis not present

## 2018-02-17 DIAGNOSIS — M9901 Segmental and somatic dysfunction of cervical region: Secondary | ICD-10-CM | POA: Diagnosis not present

## 2018-02-17 DIAGNOSIS — M9902 Segmental and somatic dysfunction of thoracic region: Secondary | ICD-10-CM | POA: Diagnosis not present

## 2018-03-15 DIAGNOSIS — M9902 Segmental and somatic dysfunction of thoracic region: Secondary | ICD-10-CM | POA: Diagnosis not present

## 2018-03-15 DIAGNOSIS — M9901 Segmental and somatic dysfunction of cervical region: Secondary | ICD-10-CM | POA: Diagnosis not present

## 2018-03-15 DIAGNOSIS — M9904 Segmental and somatic dysfunction of sacral region: Secondary | ICD-10-CM | POA: Diagnosis not present

## 2018-03-15 DIAGNOSIS — M9903 Segmental and somatic dysfunction of lumbar region: Secondary | ICD-10-CM | POA: Diagnosis not present

## 2018-03-19 ENCOUNTER — Encounter: Payer: BLUE CROSS/BLUE SHIELD | Admitting: Family Medicine

## 2018-03-24 ENCOUNTER — Encounter: Payer: BLUE CROSS/BLUE SHIELD | Admitting: Family Medicine

## 2018-05-22 DIAGNOSIS — Z1211 Encounter for screening for malignant neoplasm of colon: Secondary | ICD-10-CM

## 2018-05-22 HISTORY — DX: Encounter for screening for malignant neoplasm of colon: Z12.11

## 2018-06-07 ENCOUNTER — Ambulatory Visit (INDEPENDENT_AMBULATORY_CARE_PROVIDER_SITE_OTHER): Payer: BLUE CROSS/BLUE SHIELD | Admitting: Family Medicine

## 2018-06-07 ENCOUNTER — Encounter: Payer: Self-pay | Admitting: Family Medicine

## 2018-06-07 VITALS — BP 110/64 | HR 68 | Temp 97.5°F | Resp 16 | Ht 64.75 in | Wt 116.4 lb

## 2018-06-07 DIAGNOSIS — E7800 Pure hypercholesterolemia, unspecified: Secondary | ICD-10-CM

## 2018-06-07 DIAGNOSIS — R109 Unspecified abdominal pain: Secondary | ICD-10-CM | POA: Diagnosis not present

## 2018-06-07 DIAGNOSIS — H6121 Impacted cerumen, right ear: Secondary | ICD-10-CM

## 2018-06-07 DIAGNOSIS — Z1211 Encounter for screening for malignant neoplasm of colon: Secondary | ICD-10-CM | POA: Diagnosis not present

## 2018-06-07 DIAGNOSIS — Z1239 Encounter for other screening for malignant neoplasm of breast: Secondary | ICD-10-CM

## 2018-06-07 DIAGNOSIS — E2839 Other primary ovarian failure: Secondary | ICD-10-CM | POA: Diagnosis not present

## 2018-06-07 DIAGNOSIS — Z0000 Encounter for general adult medical examination without abnormal findings: Secondary | ICD-10-CM | POA: Diagnosis not present

## 2018-06-07 LAB — COMPREHENSIVE METABOLIC PANEL
ALT: 16 U/L (ref 0–35)
AST: 19 U/L (ref 0–37)
Albumin: 4.7 g/dL (ref 3.5–5.2)
Alkaline Phosphatase: 50 U/L (ref 39–117)
BUN: 24 mg/dL — ABNORMAL HIGH (ref 6–23)
CO2: 30 mEq/L (ref 19–32)
Calcium: 9.5 mg/dL (ref 8.4–10.5)
Chloride: 103 mEq/L (ref 96–112)
Creatinine, Ser: 0.71 mg/dL (ref 0.40–1.20)
GFR: 82.35 mL/min (ref >60.00)
Glucose, Bld: 91 mg/dL (ref 70–99)
Potassium: 4.6 mEq/L (ref 3.5–5.1)
Sodium: 141 mEq/L (ref 135–145)
Total Bilirubin: 1 mg/dL (ref 0.2–1.2)
Total Protein: 6.9 g/dL (ref 6.0–8.3)

## 2018-06-07 LAB — LIPID PANEL
Cholesterol: 265 mg/dL — ABNORMAL HIGH (ref 0–200)
HDL: 80.6 mg/dL (ref >39.00)
LDL Cholesterol: 174 mg/dL — ABNORMAL HIGH (ref 0–99)
NonHDL: 184.49
Total CHOL/HDL Ratio: 3
Triglycerides: 52 mg/dL (ref 0.0–149.0)
VLDL: 10.4 mg/dL (ref 0.0–40.0)

## 2018-06-07 LAB — CBC WITH DIFFERENTIAL/PLATELET
Basophils Absolute: 0 10*3/uL (ref 0.0–0.1)
Basophils Relative: 0.5 % (ref 0.0–3.0)
Eosinophils Absolute: 0.1 10*3/uL (ref 0.0–0.7)
Eosinophils Relative: 0.9 % (ref 0.0–5.0)
HCT: 42.1 % (ref 36.0–46.0)
Hemoglobin: 14.1 g/dL (ref 12.0–15.0)
Lymphocytes Relative: 28 % (ref 12.0–46.0)
Lymphs Abs: 1.7 10*3/uL (ref 0.7–4.0)
MCHC: 33.5 g/dL (ref 30.0–36.0)
MCV: 96.5 fl (ref 78.0–100.0)
Monocytes Absolute: 0.5 10*3/uL (ref 0.1–1.0)
Monocytes Relative: 8 % (ref 3.0–12.0)
Neutro Abs: 3.8 10*3/uL (ref 1.4–7.7)
Neutrophils Relative %: 62.6 % (ref 43.0–77.0)
Platelets: 238 10*3/uL (ref 150.0–400.0)
RBC: 4.36 Mil/uL (ref 3.87–5.11)
RDW: 13.1 % (ref 11.5–15.5)
WBC: 6.1 10*3/uL (ref 4.0–10.5)

## 2018-06-07 LAB — POCT URINALYSIS DIPSTICK
Bilirubin, UA: NEGATIVE
Blood, UA: NEGATIVE
Glucose, UA: NEGATIVE
Leukocytes, UA: NEGATIVE
Nitrite, UA: NEGATIVE
Protein, UA: NEGATIVE
Spec Grav, UA: 1.025 (ref 1.010–1.025)
Urobilinogen, UA: 0.2 E.U./dL
pH, UA: 6 (ref 5.0–8.0)

## 2018-06-07 NOTE — Patient Instructions (Signed)

## 2018-06-07 NOTE — Progress Notes (Signed)
Office Note 06/07/2018  CC:  Chief Complaint  Patient presents with  . Annual Exam    Pt is fasting.     HPI:  Katherine Villanueva is a 68 y.o. White female who is here for annual health maintenance exam.  Feeling well except having mild R flank pain once last week, lasted short period.  Yesterday occurred and lasted most of day on and off.  Mild amount this morning.  No gross hematuria.  No fever, no n/v. She does say she has a kidney stone pass before in the remote past.  Past Medical History:  Diagnosis Date  . GERD (gastroesophageal reflux disease)   . Hypercholesterolemia 02/2017   LDL > 200.  Statin recommended.  Pt chose TLC and LDL decreased to 130 in 2 months.  . Left inguinal hernia 11/2016   Watchful waiting approach  . Rheumatic fever     Past Surgical History:  Procedure Laterality Date  . COLONOSCOPY     X 2.  Most recent ? 20014?  No abnormals.  [name] LAPAROSCOPY  1981   "for possible infertility"  . TONSILLECTOMY  1960    Family History  Problem Relation Age of Onset  . Arthritis Mother   . Stroke Mother   . Kidney disease Mother   . Hyperlipidemia Father   . Heart disease Father   . Diabetes Father   . Stroke Brother   . Galactosemia Son   . Lymphoma Paternal Grandmother   . Diabetes Paternal Grandfather     Social History   Socioeconomic History  . Marital status: Married    Spouse name: Not on file  . Number of children: Not on file  . Years of education: Not on file  . Highest education level: Not on file  Occupational History  . Not on file  Social Needs  . Financial resource strain: Not on file  . Food insecurity:    Worry: Not on file    Inability: Not on file  . Transportation needs:    Medical: Not on file    Non-medical: Not on file  Tobacco Use  . Smoking status: Never Smoker  . Smokeless tobacco: Never Used  Substance and Sexual Activity  . Alcohol use: Yes    Alcohol/week: 4.0 standard drinks    Types: 4 Glasses of  wine per week  . Drug use: No  . Sexual activity: Not on file  Lifestyle  . Physical activity:    Days per week: Not on file    Minutes per session: Not on file  . Stress: Not on file  Relationships  . Social connections:    Talks on phone: Not on file    Gets together: Not on file    Attends religious service: Not on file    Active member of club or organization: Not on file    Attends meetings of clubs or organizations: Not on file    Relationship status: Not on file  . Intimate partner violence:    Fear of current or ex partner: Not on file    Emotionally abused: Not on file    Physically abused: Not on file    Forced sexual activity: Not on file  Other Topics Concern  . Not on file  Social History Narrative   Married, 1 daughter and one son.   Educ: nursing school.   Occup: retired--hx of LPN work, sign Engineer, technical sales.  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   Moved from [place] 06/2016.  Alc: 4 glasses red wine per week.   No tobacco or drugs.    No outpatient medications prior to visit.   No facility-administered medications prior to visit.     No Known Allergies  ROS Review of Systems  Constitutional: Negative for appetite change, chills, fatigue and fever.  HENT: Negative for congestion, dental problem, ear pain and sore throat.   Eyes: Negative for discharge, redness and visual disturbance.  Respiratory: Negative for cough, chest tightness, shortness of breath and wheezing.   Cardiovascular: Negative for chest pain, palpitations and leg swelling.  Gastrointestinal: Negative for abdominal pain, blood in stool, diarrhea, nausea and vomiting.  Genitourinary: Positive for flank pain (note HPI). Negative for difficulty urinating, dysuria, frequency, hematuria and urgency.  Musculoskeletal: Negative for arthralgias, back pain, joint swelling, myalgias and neck stiffness.  Skin: Negative for pallor and rash.  Neurological: Negative for dizziness, speech difficulty, weakness  and headaches.  Hematological: Negative for adenopathy. Does not bruise/bleed easily.  Psychiatric/Behavioral: Negative for confusion and sleep disturbance. The patient is not nervous/anxious.     PE; Blood pressure 110/64, pulse 68, temperature (!) 97.5 F (36.4 C), temperature source Oral, resp. rate 16, height 5' 4.75" (1.645 m), weight 116 lb 6 oz (52.8 kg), SpO2 100 %.  Pt examined with Pryor Ochoa, CMA, as chaperone.  Gen: Alert, well appearing.  Patient is oriented to person, place, time, and situation. AFFECT: pleasant, lucid thought and speech. ENT: Ears: EACs clear, normal epithelium.  TMs with good light reflex and landmarks bilaterally.  Eyes: no injection, icteris, swelling, or exudate.  EOMI, PERRLA. Nose: no drainage or turbinate edema/swelling.  No injection or focal lesion.  Mouth: lips without lesion/swelling.  Oral mucosa pink and moist.  Dentition intact and without obvious caries or gingival swelling.  Oropharynx without erythema, exudate, or swelling.  Neck: supple/nontender.  No LAD, mass, or TM.  Carotid pulses 2+ bilaterally, without bruits. CV: RRR, no m/r/g.   LUNGS: CTA bilat, nonlabored resps, good aeration in all lung fields. ABD: soft, NT, ND, BS normal.  No hepatospenomegaly or mass.  No bruits.  No flank or CV angle tenderness. EXT: no clubbing, cyanosis, or edema.  Musculoskeletal: no joint swelling, erythema, warmth, or tenderness.  ROM of all joints intact. Skin - no sores or suspicious lesions or rashes or color changes   Pertinent labs:  Lab Results  Component Value Date   TSH 3.18 03/18/2017   Lab Results  Component Value Date   WBC 7.5 03/18/2017   HGB 13.5 03/18/2017   HCT 41.0 03/18/2017   MCV 97.2 03/18/2017   PLT 273.0 03/18/2017   Lab Results  Component Value Date   CREATININE 0.67 03/18/2017   BUN 19 03/18/2017   NA 140 03/18/2017   K 4.0 03/18/2017   CL 103 03/18/2017   CO2 30 03/18/2017   Lab Results  Component Value  Date   ALT 29 03/18/2017   AST 26 03/18/2017   ALKPHOS 58 03/18/2017   BILITOT 1.0 03/18/2017   Lab Results  Component Value Date   CHOL 211 (H) 05/29/2017   Lab Results  Component Value Date   HDL 71.60 05/29/2017   Lab Results  Component Value Date   LDLCALC 130 (H) 05/29/2017   Lab Results  Component Value Date   TRIG 47.0 05/29/2017   Lab Results  Component Value Date   CHOLHDL 3 05/29/2017    CC UA today: NORMAL.  ASSESSMENT AND PLAN:   1) Right flank discomfort;  normal exam, mild sx's.   CC UA today:  NORMAL.  Monitor sx's at this time.  2) Health maintenance exam: Reviewed age and gender appropriate health maintenance issues (prudent diet, regular exercise, health risks of tobacco and excessive alcohol, use of seatbelts, fire alarms in home, use of sunscreen).  Also reviewed age and gender appropriate health screening as well as vaccine recommendations. Vaccines: Tdap -->pt declines.   Flu vaccine-->pt declines.    Pneumovax 23 -->pt declines.   Shingrix-->pt declines. Labs: fasting HP labs. Osteoporosis screening: DEXA--> ordered today. Cervical ca screening: pap normal 02/2017.  As per latest cerv ca screening guidelines, no further pap smears needed. Breast ca screening: mammogram--> will order today. Colon ca screening: last colonoscopy approx 2014 per pt estimate, no hx of abnormal colonoscopy-->pt wants to do cologuard today.  3) R ear cerumen impaction: pt unable to tolerate extraction by curette so nurse irrigated R ear today: pt could not tolerate irrigation for more than a few seconds. Discussed options of OTC drops + time vs referral to ENT for extraction.  She chose drops + time today.  An After Visit Summary was printed and given to the patient.  FOLLOW UP:  Return in about 1 year (around 06/08/2019) for annual CPE (fasting).  Signed:  Santiago BumpersPhil Nicolaos Mitrano, MD           06/07/2018

## 2018-06-07 NOTE — Addendum Note (Signed)
Addended by: Smitty Knudsen on: 06/07/2018 10:57 AM   Modules accepted: Orders

## 2018-06-09 ENCOUNTER — Encounter: Payer: Self-pay | Admitting: Family Medicine

## 2018-06-09 ENCOUNTER — Encounter: Payer: Self-pay | Admitting: *Deleted

## 2018-06-09 LAB — TSH: TSH: 2.93 u[IU]/mL (ref 0.35–4.50)

## 2018-06-15 DIAGNOSIS — Z1211 Encounter for screening for malignant neoplasm of colon: Secondary | ICD-10-CM | POA: Diagnosis not present

## 2018-06-15 LAB — COLOGUARD: Cologuard: NEGATIVE

## 2018-06-21 ENCOUNTER — Other Ambulatory Visit: Payer: Self-pay

## 2018-06-21 ENCOUNTER — Encounter: Payer: Self-pay | Admitting: Family Medicine

## 2018-06-21 DIAGNOSIS — Z1211 Encounter for screening for malignant neoplasm of colon: Secondary | ICD-10-CM

## 2018-06-24 DIAGNOSIS — M9903 Segmental and somatic dysfunction of lumbar region: Secondary | ICD-10-CM | POA: Diagnosis not present

## 2018-06-24 DIAGNOSIS — M9901 Segmental and somatic dysfunction of cervical region: Secondary | ICD-10-CM | POA: Diagnosis not present

## 2018-06-24 DIAGNOSIS — M9904 Segmental and somatic dysfunction of sacral region: Secondary | ICD-10-CM | POA: Diagnosis not present

## 2018-06-24 DIAGNOSIS — M9902 Segmental and somatic dysfunction of thoracic region: Secondary | ICD-10-CM | POA: Diagnosis not present

## 2018-08-17 ENCOUNTER — Other Ambulatory Visit: Payer: BLUE CROSS/BLUE SHIELD

## 2018-08-17 ENCOUNTER — Ambulatory Visit: Payer: BLUE CROSS/BLUE SHIELD

## 2018-08-18 ENCOUNTER — Ambulatory Visit: Payer: BLUE CROSS/BLUE SHIELD

## 2018-08-18 ENCOUNTER — Other Ambulatory Visit: Payer: BLUE CROSS/BLUE SHIELD

## 2018-11-04 ENCOUNTER — Ambulatory Visit
Admission: RE | Admit: 2018-11-04 | Discharge: 2018-11-04 | Disposition: A | Discharge status: Home / Self Care | Payer: BLUE CROSS/BLUE SHIELD | Source: Ambulatory Visit | Attending: Family Medicine | Admitting: Family Medicine

## 2018-11-04 ENCOUNTER — Other Ambulatory Visit: Payer: Self-pay

## 2018-11-04 DIAGNOSIS — M810 Age-related osteoporosis without current pathological fracture: Secondary | ICD-10-CM

## 2018-11-04 DIAGNOSIS — Z1231 Encounter for screening mammogram for malignant neoplasm of breast: Secondary | ICD-10-CM | POA: Diagnosis not present

## 2018-11-04 DIAGNOSIS — Z780 Asymptomatic menopausal state: Secondary | ICD-10-CM | POA: Diagnosis not present

## 2018-11-04 DIAGNOSIS — E2839 Other primary ovarian failure: Secondary | ICD-10-CM

## 2018-11-04 DIAGNOSIS — Z1239 Encounter for other screening for malignant neoplasm of breast: Secondary | ICD-10-CM

## 2018-11-04 HISTORY — PX: OTHER SURGICAL HISTORY: SHX169

## 2018-11-04 HISTORY — DX: Age-related osteoporosis without current pathological fracture: M81.0

## 2018-11-08 ENCOUNTER — Encounter: Payer: Self-pay | Admitting: Family Medicine

## 2018-12-06 DIAGNOSIS — M9902 Segmental and somatic dysfunction of thoracic region: Secondary | ICD-10-CM | POA: Diagnosis not present

## 2018-12-06 DIAGNOSIS — M9901 Segmental and somatic dysfunction of cervical region: Secondary | ICD-10-CM | POA: Diagnosis not present

## 2018-12-06 DIAGNOSIS — M9903 Segmental and somatic dysfunction of lumbar region: Secondary | ICD-10-CM | POA: Diagnosis not present

## 2018-12-06 DIAGNOSIS — M9904 Segmental and somatic dysfunction of sacral region: Secondary | ICD-10-CM | POA: Diagnosis not present

## 2018-12-07 DIAGNOSIS — M9901 Segmental and somatic dysfunction of cervical region: Secondary | ICD-10-CM | POA: Diagnosis not present

## 2018-12-07 DIAGNOSIS — M9903 Segmental and somatic dysfunction of lumbar region: Secondary | ICD-10-CM | POA: Diagnosis not present

## 2018-12-07 DIAGNOSIS — M9904 Segmental and somatic dysfunction of sacral region: Secondary | ICD-10-CM | POA: Diagnosis not present

## 2018-12-07 DIAGNOSIS — M9902 Segmental and somatic dysfunction of thoracic region: Secondary | ICD-10-CM | POA: Diagnosis not present

## 2018-12-16 DIAGNOSIS — M9901 Segmental and somatic dysfunction of cervical region: Secondary | ICD-10-CM | POA: Diagnosis not present

## 2018-12-16 DIAGNOSIS — M9902 Segmental and somatic dysfunction of thoracic region: Secondary | ICD-10-CM | POA: Diagnosis not present

## 2018-12-16 DIAGNOSIS — M9903 Segmental and somatic dysfunction of lumbar region: Secondary | ICD-10-CM | POA: Diagnosis not present

## 2018-12-16 DIAGNOSIS — M9904 Segmental and somatic dysfunction of sacral region: Secondary | ICD-10-CM | POA: Diagnosis not present

## 2018-12-20 DIAGNOSIS — M9901 Segmental and somatic dysfunction of cervical region: Secondary | ICD-10-CM | POA: Diagnosis not present

## 2018-12-20 DIAGNOSIS — M9904 Segmental and somatic dysfunction of sacral region: Secondary | ICD-10-CM | POA: Diagnosis not present

## 2018-12-20 DIAGNOSIS — M9903 Segmental and somatic dysfunction of lumbar region: Secondary | ICD-10-CM | POA: Diagnosis not present

## 2018-12-20 DIAGNOSIS — M9902 Segmental and somatic dysfunction of thoracic region: Secondary | ICD-10-CM | POA: Diagnosis not present

## 2018-12-23 DIAGNOSIS — M9903 Segmental and somatic dysfunction of lumbar region: Secondary | ICD-10-CM | POA: Diagnosis not present

## 2018-12-23 DIAGNOSIS — M9901 Segmental and somatic dysfunction of cervical region: Secondary | ICD-10-CM | POA: Diagnosis not present

## 2018-12-23 DIAGNOSIS — M9902 Segmental and somatic dysfunction of thoracic region: Secondary | ICD-10-CM | POA: Diagnosis not present

## 2018-12-23 DIAGNOSIS — M9904 Segmental and somatic dysfunction of sacral region: Secondary | ICD-10-CM | POA: Diagnosis not present

## 2018-12-28 DIAGNOSIS — M9902 Segmental and somatic dysfunction of thoracic region: Secondary | ICD-10-CM | POA: Diagnosis not present

## 2018-12-28 DIAGNOSIS — M9901 Segmental and somatic dysfunction of cervical region: Secondary | ICD-10-CM | POA: Diagnosis not present

## 2018-12-28 DIAGNOSIS — M9903 Segmental and somatic dysfunction of lumbar region: Secondary | ICD-10-CM | POA: Diagnosis not present

## 2018-12-28 DIAGNOSIS — M9904 Segmental and somatic dysfunction of sacral region: Secondary | ICD-10-CM | POA: Diagnosis not present

## 2018-12-29 DIAGNOSIS — M9901 Segmental and somatic dysfunction of cervical region: Secondary | ICD-10-CM | POA: Diagnosis not present

## 2018-12-29 DIAGNOSIS — M9904 Segmental and somatic dysfunction of sacral region: Secondary | ICD-10-CM | POA: Diagnosis not present

## 2018-12-29 DIAGNOSIS — M9902 Segmental and somatic dysfunction of thoracic region: Secondary | ICD-10-CM | POA: Diagnosis not present

## 2018-12-29 DIAGNOSIS — M9903 Segmental and somatic dysfunction of lumbar region: Secondary | ICD-10-CM | POA: Diagnosis not present

## 2019-01-03 DIAGNOSIS — M9904 Segmental and somatic dysfunction of sacral region: Secondary | ICD-10-CM | POA: Diagnosis not present

## 2019-01-03 DIAGNOSIS — M9902 Segmental and somatic dysfunction of thoracic region: Secondary | ICD-10-CM | POA: Diagnosis not present

## 2019-01-03 DIAGNOSIS — M9903 Segmental and somatic dysfunction of lumbar region: Secondary | ICD-10-CM | POA: Diagnosis not present

## 2019-01-03 DIAGNOSIS — M9901 Segmental and somatic dysfunction of cervical region: Secondary | ICD-10-CM | POA: Diagnosis not present

## 2019-01-06 DIAGNOSIS — M9903 Segmental and somatic dysfunction of lumbar region: Secondary | ICD-10-CM | POA: Diagnosis not present

## 2019-01-06 DIAGNOSIS — M9904 Segmental and somatic dysfunction of sacral region: Secondary | ICD-10-CM | POA: Diagnosis not present

## 2019-01-06 DIAGNOSIS — M9902 Segmental and somatic dysfunction of thoracic region: Secondary | ICD-10-CM | POA: Diagnosis not present

## 2019-01-06 DIAGNOSIS — M9901 Segmental and somatic dysfunction of cervical region: Secondary | ICD-10-CM | POA: Diagnosis not present

## 2019-01-11 DIAGNOSIS — M9903 Segmental and somatic dysfunction of lumbar region: Secondary | ICD-10-CM | POA: Diagnosis not present

## 2019-01-11 DIAGNOSIS — M9904 Segmental and somatic dysfunction of sacral region: Secondary | ICD-10-CM | POA: Diagnosis not present

## 2019-01-11 DIAGNOSIS — M9901 Segmental and somatic dysfunction of cervical region: Secondary | ICD-10-CM | POA: Diagnosis not present

## 2019-01-11 DIAGNOSIS — M9902 Segmental and somatic dysfunction of thoracic region: Secondary | ICD-10-CM | POA: Diagnosis not present

## 2019-01-18 DIAGNOSIS — M9903 Segmental and somatic dysfunction of lumbar region: Secondary | ICD-10-CM | POA: Diagnosis not present

## 2019-01-18 DIAGNOSIS — M9901 Segmental and somatic dysfunction of cervical region: Secondary | ICD-10-CM | POA: Diagnosis not present

## 2019-01-18 DIAGNOSIS — M9902 Segmental and somatic dysfunction of thoracic region: Secondary | ICD-10-CM | POA: Diagnosis not present

## 2019-01-18 DIAGNOSIS — M9904 Segmental and somatic dysfunction of sacral region: Secondary | ICD-10-CM | POA: Diagnosis not present

## 2019-02-01 DIAGNOSIS — M9904 Segmental and somatic dysfunction of sacral region: Secondary | ICD-10-CM | POA: Diagnosis not present

## 2019-02-01 DIAGNOSIS — M9901 Segmental and somatic dysfunction of cervical region: Secondary | ICD-10-CM | POA: Diagnosis not present

## 2019-02-01 DIAGNOSIS — M9903 Segmental and somatic dysfunction of lumbar region: Secondary | ICD-10-CM | POA: Diagnosis not present

## 2019-02-01 DIAGNOSIS — M9902 Segmental and somatic dysfunction of thoracic region: Secondary | ICD-10-CM | POA: Diagnosis not present

## 2019-02-15 DIAGNOSIS — M9904 Segmental and somatic dysfunction of sacral region: Secondary | ICD-10-CM | POA: Diagnosis not present

## 2019-02-15 DIAGNOSIS — M9903 Segmental and somatic dysfunction of lumbar region: Secondary | ICD-10-CM | POA: Diagnosis not present

## 2019-02-15 DIAGNOSIS — M9901 Segmental and somatic dysfunction of cervical region: Secondary | ICD-10-CM | POA: Diagnosis not present

## 2019-02-15 DIAGNOSIS — M9902 Segmental and somatic dysfunction of thoracic region: Secondary | ICD-10-CM | POA: Diagnosis not present

## 2019-03-08 DIAGNOSIS — M9902 Segmental and somatic dysfunction of thoracic region: Secondary | ICD-10-CM | POA: Diagnosis not present

## 2019-03-08 DIAGNOSIS — M9901 Segmental and somatic dysfunction of cervical region: Secondary | ICD-10-CM | POA: Diagnosis not present

## 2019-03-08 DIAGNOSIS — M9904 Segmental and somatic dysfunction of sacral region: Secondary | ICD-10-CM | POA: Diagnosis not present

## 2019-03-08 DIAGNOSIS — M9903 Segmental and somatic dysfunction of lumbar region: Secondary | ICD-10-CM | POA: Diagnosis not present

## 2019-03-28 DIAGNOSIS — M9901 Segmental and somatic dysfunction of cervical region: Secondary | ICD-10-CM | POA: Diagnosis not present

## 2019-03-28 DIAGNOSIS — M9904 Segmental and somatic dysfunction of sacral region: Secondary | ICD-10-CM | POA: Diagnosis not present

## 2019-03-28 DIAGNOSIS — M9903 Segmental and somatic dysfunction of lumbar region: Secondary | ICD-10-CM | POA: Diagnosis not present

## 2019-03-28 DIAGNOSIS — M9902 Segmental and somatic dysfunction of thoracic region: Secondary | ICD-10-CM | POA: Diagnosis not present

## 2019-04-05 DIAGNOSIS — M9904 Segmental and somatic dysfunction of sacral region: Secondary | ICD-10-CM | POA: Diagnosis not present

## 2019-04-05 DIAGNOSIS — M9902 Segmental and somatic dysfunction of thoracic region: Secondary | ICD-10-CM | POA: Diagnosis not present

## 2019-04-05 DIAGNOSIS — M9903 Segmental and somatic dysfunction of lumbar region: Secondary | ICD-10-CM | POA: Diagnosis not present

## 2019-04-05 DIAGNOSIS — M9901 Segmental and somatic dysfunction of cervical region: Secondary | ICD-10-CM | POA: Diagnosis not present

## 2019-05-03 DIAGNOSIS — M9904 Segmental and somatic dysfunction of sacral region: Secondary | ICD-10-CM | POA: Diagnosis not present

## 2019-05-03 DIAGNOSIS — M9901 Segmental and somatic dysfunction of cervical region: Secondary | ICD-10-CM | POA: Diagnosis not present

## 2019-05-03 DIAGNOSIS — M9903 Segmental and somatic dysfunction of lumbar region: Secondary | ICD-10-CM | POA: Diagnosis not present

## 2019-05-03 DIAGNOSIS — M9902 Segmental and somatic dysfunction of thoracic region: Secondary | ICD-10-CM | POA: Diagnosis not present

## 2019-05-23 DIAGNOSIS — R7303 Prediabetes: Secondary | ICD-10-CM

## 2019-05-23 HISTORY — DX: Prediabetes: R73.03

## 2019-05-31 DIAGNOSIS — M9903 Segmental and somatic dysfunction of lumbar region: Secondary | ICD-10-CM | POA: Diagnosis not present

## 2019-05-31 DIAGNOSIS — M9902 Segmental and somatic dysfunction of thoracic region: Secondary | ICD-10-CM | POA: Diagnosis not present

## 2019-05-31 DIAGNOSIS — M9904 Segmental and somatic dysfunction of sacral region: Secondary | ICD-10-CM | POA: Diagnosis not present

## 2019-05-31 DIAGNOSIS — M9901 Segmental and somatic dysfunction of cervical region: Secondary | ICD-10-CM | POA: Diagnosis not present

## 2019-06-06 DIAGNOSIS — M9903 Segmental and somatic dysfunction of lumbar region: Secondary | ICD-10-CM | POA: Diagnosis not present

## 2019-06-06 DIAGNOSIS — M9904 Segmental and somatic dysfunction of sacral region: Secondary | ICD-10-CM | POA: Diagnosis not present

## 2019-06-06 DIAGNOSIS — M9901 Segmental and somatic dysfunction of cervical region: Secondary | ICD-10-CM | POA: Diagnosis not present

## 2019-06-06 DIAGNOSIS — M9902 Segmental and somatic dysfunction of thoracic region: Secondary | ICD-10-CM | POA: Diagnosis not present

## 2019-06-07 DIAGNOSIS — M9901 Segmental and somatic dysfunction of cervical region: Secondary | ICD-10-CM | POA: Diagnosis not present

## 2019-06-07 DIAGNOSIS — M9903 Segmental and somatic dysfunction of lumbar region: Secondary | ICD-10-CM | POA: Diagnosis not present

## 2019-06-07 DIAGNOSIS — M9902 Segmental and somatic dysfunction of thoracic region: Secondary | ICD-10-CM | POA: Diagnosis not present

## 2019-06-07 DIAGNOSIS — M9904 Segmental and somatic dysfunction of sacral region: Secondary | ICD-10-CM | POA: Diagnosis not present

## 2019-06-13 ENCOUNTER — Ambulatory Visit (INDEPENDENT_AMBULATORY_CARE_PROVIDER_SITE_OTHER): Payer: BLUE CROSS/BLUE SHIELD | Admitting: Family Medicine

## 2019-06-13 ENCOUNTER — Encounter: Payer: Self-pay | Admitting: Family Medicine

## 2019-06-13 ENCOUNTER — Other Ambulatory Visit: Payer: Self-pay

## 2019-06-13 VITALS — BP 106/71 | HR 68 | Temp 98.0°F | Resp 16 | Ht 64.75 in | Wt 121.4 lb

## 2019-06-13 DIAGNOSIS — Z1211 Encounter for screening for malignant neoplasm of colon: Secondary | ICD-10-CM

## 2019-06-13 DIAGNOSIS — M810 Age-related osteoporosis without current pathological fracture: Secondary | ICD-10-CM

## 2019-06-13 DIAGNOSIS — E7800 Pure hypercholesterolemia, unspecified: Secondary | ICD-10-CM | POA: Diagnosis not present

## 2019-06-13 DIAGNOSIS — Z0000 Encounter for general adult medical examination without abnormal findings: Secondary | ICD-10-CM | POA: Diagnosis not present

## 2019-06-13 DIAGNOSIS — Z1231 Encounter for screening mammogram for malignant neoplasm of breast: Secondary | ICD-10-CM

## 2019-06-13 LAB — CBC WITH DIFFERENTIAL/PLATELET
Basophils Absolute: 0 10*3/uL (ref 0.0–0.1)
Basophils Relative: 0.5 % (ref 0.0–3.0)
Eosinophils Absolute: 0.1 10*3/uL (ref 0.0–0.7)
Eosinophils Relative: 2.1 % (ref 0.0–5.0)
HCT: 40.7 % (ref 36.0–46.0)
Hemoglobin: 13.5 g/dL (ref 12.0–15.0)
Lymphocytes Relative: 30.8 % (ref 12.0–46.0)
Lymphs Abs: 1.7 10*3/uL (ref 0.7–4.0)
MCHC: 33.1 g/dL (ref 30.0–36.0)
MCV: 96.1 fl (ref 78.0–100.0)
Monocytes Absolute: 0.5 10*3/uL (ref 0.1–1.0)
Monocytes Relative: 9.4 % (ref 3.0–12.0)
Neutro Abs: 3.1 10*3/uL (ref 1.4–7.7)
Neutrophils Relative %: 57.2 % (ref 43.0–77.0)
Platelets: 230 10*3/uL (ref 150.0–400.0)
RBC: 4.24 Mil/uL (ref 3.87–5.11)
RDW: 13.1 % (ref 11.5–15.5)
WBC: 5.4 10*3/uL (ref 4.0–10.5)

## 2019-06-13 LAB — TSH: TSH: 3.4 u[IU]/mL (ref 0.35–4.50)

## 2019-06-13 NOTE — Patient Instructions (Signed)
Health Maintenance, Female Adopting a healthy lifestyle and getting preventive care are important in promoting health and wellness. Ask your health care provider about:  The right schedule for you to have regular tests and exams.  Things you can do on your own to prevent diseases and keep yourself healthy. What should I know about diet, weight, and exercise? Eat a healthy diet   Eat a diet that includes plenty of vegetables, fruits, low-fat dairy products, and lean protein.  Do not eat a lot of foods that are high in solid fats, added sugars, or sodium. Maintain a healthy weight Body mass index (BMI) is used to identify weight problems. It estimates body fat based on height and weight. Your health care provider can help determine your BMI and help you achieve or maintain a healthy weight. Get regular exercise Get regular exercise. This is one of the most important things you can do for your health. Most adults should:  Exercise for at least 150 minutes each week. The exercise should increase your heart rate and make you sweat (moderate-intensity exercise).  Do strengthening exercises at least twice a week. This is in addition to the moderate-intensity exercise.  Spend less time sitting. Even light physical activity can be beneficial. Watch cholesterol and blood lipids Have your blood tested for lipids and cholesterol at 67 years of age, then have this test every 5 years. Have your cholesterol levels checked more often if:  Your lipid or cholesterol levels are high.  You are older than 67 years of age.  You are at high risk for heart disease. What should I know about cancer screening? Depending on your health history and family history, you may need to have cancer screening at various ages. This may include screening for:  Breast cancer.  Cervical cancer.  Colorectal cancer.  Skin cancer.  Lung cancer. What should I know about heart disease, diabetes, and high blood  pressure? Blood pressure and heart disease  High blood pressure causes heart disease and increases the risk of stroke. This is more likely to develop in people who have high blood pressure readings, are of African descent, or are overweight.  Have your blood pressure checked: ? Every 3-5 years if you are 18-39 years of age. ? Every year if you are 40 years old or older. Diabetes Have regular diabetes screenings. This checks your fasting blood sugar level. Have the screening done:  Once every three years after age 40 if you are at a normal weight and have a low risk for diabetes.  More often and at a younger age if you are overweight or have a high risk for diabetes. What should I know about preventing infection? Hepatitis B If you have a higher risk for hepatitis B, you should be screened for this virus. Talk with your health care provider to find out if you are at risk for hepatitis B infection. Hepatitis C Testing is recommended for:  Everyone born from 1945 through 1965.  Anyone with known risk factors for hepatitis C. Sexually transmitted infections (STIs)  Get screened for STIs, including gonorrhea and chlamydia, if: ? You are sexually active and are younger than 67 years of age. ? You are older than 67 years of age and your health care provider tells you that you are at risk for this type of infection. ? Your sexual activity has changed since you were last screened, and you are at increased risk for chlamydia or gonorrhea. Ask your health care provider if   you are at risk.  Ask your health care provider about whether you are at high risk for HIV. Your health care provider may recommend a prescription medicine to help prevent HIV infection. If you choose to take medicine to prevent HIV, you should first get tested for HIV. You should then be tested every 3 months for as long as you are taking the medicine. Pregnancy  If you are about to stop having your period (premenopausal) and  you may become pregnant, seek counseling before you get pregnant.  Take 400 to 800 micrograms (mcg) of folic acid every day if you become pregnant.  Ask for birth control (contraception) if you want to prevent pregnancy. Osteoporosis and menopause Osteoporosis is a disease in which the bones lose minerals and strength with aging. This can result in bone fractures. If you are 65 years old or older, or if you are at risk for osteoporosis and fractures, ask your health care provider if you should:  Be screened for bone loss.  Take a calcium or vitamin D supplement to lower your risk of fractures.  Be given hormone replacement therapy (HRT) to treat symptoms of menopause. Follow these instructions at home: Lifestyle  Do not use any products that contain nicotine or tobacco, such as cigarettes, e-cigarettes, and chewing tobacco. If you need help quitting, ask your health care provider.  Do not use street drugs.  Do not share needles.  Ask your health care provider for help if you need support or information about quitting drugs. Alcohol use  Do not drink alcohol if: ? Your health care provider tells you not to drink. ? You are pregnant, may be pregnant, or are planning to become pregnant.  If you drink alcohol: ? Limit how much you use to 0-1 drink a day. ? Limit intake if you are breastfeeding.  Be aware of how much alcohol is in your drink. In the [place] one drink equals one 12 oz bottle of beer (355 mL), one 5 oz glass of wine (148 mL), or one 1 oz glass of hard liquor (44 mL). General instructions  Schedule regular health, dental, and eye exams.  Stay current with your vaccines.  Tell your health care provider if: ? You often feel depressed. ? You have ever been abused or do not feel safe at home. Summary  Adopting a healthy lifestyle and getting preventive care are important in promoting health and wellness.  Follow your health care provider's instructions about healthy  diet, exercising, and getting tested or screened for diseases.  Follow your health care provider's instructions on monitoring your cholesterol and blood pressure. This information is not intended to replace advice given to you by your health care provider. Make sure you discuss any questions you have with your health care provider. Document Revised: 03/31/2018 Document Reviewed: 03/31/2018 Elsevier Patient Education  2020 [employer]  

## 2019-06-13 NOTE — Progress Notes (Signed)
Office Note 06/13/2019  CC:  Chief Complaint  Patient presents with  . Annual Exam    pt is fasting    HPI:  Katherine Villanueva is a 69 y.o. White female who is here for annual health maintenance exam.    Past Medical History:  Diagnosis Date  . Colon cancer screening 05/2018   Cologuard neg 05/2018; repeat 2023.  [name] GERD (gastroesophageal reflux disease)   . History of kidney stones   . Hypercholesterolemia 02/2017   LDL > 200.  Statin recommended.  Pt chose TLC and LDL decreased to 130 in 2 months.  Rpt 05/2018: LDL 170s, HDL 80->Fram rx = 4.5%->continue TLC  . Left inguinal hernia 11/2016   Watchful waiting approach  . Osteoporosis 11/04/2018   T score -3.2; recommended fosamax 10/2018.  [name] Rheumatic fever     Past Surgical History:  Procedure Laterality Date  . COLONOSCOPY     X 2.  Most recent ? 20014?  No abnormals.  [name] DEXA  11/04/2018   T score -3.2 spine  . LAPAROSCOPY  1981   "for possible infertility"  . TONSILLECTOMY  1960    Family History  Problem Relation Age of Onset  . Arthritis Mother   . Stroke Mother   . Kidney disease Mother   . Hyperlipidemia Father   . Heart disease Father   . Diabetes Father   . Stroke Brother   . Galactosemia Son   . Lymphoma Paternal Grandmother   . Diabetes Paternal Grandfather     Social History   Socioeconomic History  . Marital status: Married    Spouse name: Not on file  . Number of children: Not on file  . Years of education: Not on file  . Highest education level: Not on file  Occupational History  . Not on file  Tobacco Use  . Smoking status: Never Smoker  . Smokeless tobacco: Never Used  Substance and Sexual Activity  . Alcohol use: Yes    Alcohol/week: 4.0 standard drinks    Types: 4 Glasses of wine per week  . Drug use: No  . Sexual activity: Not on file  Other Topics Concern  . Not on file  Social History Narrative   Married, 1 daughter and one son.   Educ: nursing school.   Occup:  retired--hx of LPN work, sign Sport and exercise psychologist.  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   Moved from [place] 06/2016.   Alc: 4 glasses red wine per week.   No tobacco or drugs.   Social Determinants of Health   Financial Resource Strain:   . Difficulty of Paying Living Expenses: Not on file  Food Insecurity:   . Worried About Charity fundraiser in the Last Year: Not on file  . Ran Out of Food in the Last Year: Not on file  Transportation Needs:   . Lack of Transportation (Medical): Not on file  . Lack of Transportation (Non-Medical): Not on file  Physical Activity:   . Days of Exercise per Week: Not on file  . Minutes of Exercise per Session: Not on file  Stress:   . Feeling of Stress : Not on file  Social Connections:   . Frequency of Communication with Friends and Family: Not on file  . Frequency of Social Gatherings with Friends and Family: Not on file  . Attends Religious Services: Not on file  . Active Member of Clubs or Organizations: Not on file  . Attends Archivist Meetings: Not on  file  . Marital Status: Not on file  Intimate Partner Violence:   . Fear of Current or Ex-Partner: Not on file  . Emotionally Abused: Not on file  . Physically Abused: Not on file  . Sexually Abused: Not on file    Outpatient Medications Prior to Visit  Medication Sig Dispense Refill  . ALFALFA PO Take by mouth.    . Ascorbic Acid (VITAMIN C PO) Take by mouth.    [name] BIOTIN PO Take by mouth.    . Calcium-Magnesium-Vitamin D (CALCIUM MAGNESIUM PO) Take by mouth.    . Cyanocobalamin (B-12 PO) Take by mouth.    [name] HORSE CHESTNUT PO Take by mouth.    . Iodine Strong, Lugols, (IODINE STRONG PO) Take by mouth.    . Levomefolate Glucosamine (METHYLFOLATE PO) Take by mouth.    . Misc Natural Products (ADRENAL PO) Take by mouth.    . Pyridoxine HCl (B-6 PO) Take by mouth.    [name] Specialty Vitamins Products (THYMUS PO) Take by mouth.    [name] VITAMIN D-VITAMIN K PO Take by mouth.     No  facility-administered medications prior to visit.    No Known Allergies  ROS Review of Systems  Constitutional: Negative for appetite change, chills, fatigue and fever.  HENT: Negative for congestion, dental problem, ear pain and sore throat.   Eyes: Negative for discharge, redness and visual disturbance.  Respiratory: Negative for cough, chest tightness, shortness of breath and wheezing.   Cardiovascular: Negative for chest pain, palpitations and leg swelling.  Gastrointestinal: Negative for abdominal pain, blood in stool, diarrhea, nausea and vomiting.  Genitourinary: Negative for difficulty urinating, dysuria, flank pain, frequency, hematuria and urgency.  Musculoskeletal: Negative for arthralgias, back pain, joint swelling, myalgias and neck stiffness.  Skin: Negative for pallor and rash.  Neurological: Negative for dizziness, speech difficulty, weakness and headaches.  Hematological: Negative for adenopathy. Does not bruise/bleed easily.  Psychiatric/Behavioral: Negative for confusion and sleep disturbance. The patient is not nervous/anxious.     PE; Blood pressure 106/71, pulse 68, temperature 98 F (36.7 C), temperature source Temporal, resp. rate 16, height 5' 4.75" (1.645 m), weight 121 lb 6.4 oz (55.1 kg), SpO2 98 %. Body mass index is 20.36 kg/m. Exam chaperoned by Emi Holes, CMA.  Gen: Alert, well appearing.  Patient is oriented to person, place, time, and situation. AFFECT: pleasant, lucid thought and speech. ENT: Ears: EACs clear, normal epithelium.  TMs with good light reflex and landmarks bilaterally.  Eyes: no injection, icteris, swelling, or exudate.  EOMI, PERRLA. Nose: no drainage or turbinate edema/swelling.  No injection or focal lesion.  Mouth: lips without lesion/swelling.  Oral mucosa pink and moist.  Dentition intact and without obvious caries or gingival swelling.  Oropharynx without erythema, exudate, or swelling.  Neck: supple/nontender.  No LAD,  mass, or TM.  Carotid pulses 2+ bilaterally, without bruits. CV: RRR, no m/r/g.   LUNGS: CTA bilat, nonlabored resps, good aeration in all lung fields. ABD: soft, NT, ND, BS normal.  No hepatospenomegaly or mass.  No bruits. EXT: no clubbing, cyanosis, or edema.  Musculoskeletal: no joint swelling, erythema, warmth, or tenderness.  ROM of all joints intact. Skin - no sores or suspicious lesions or rashes or color changes   Pertinent labs:  Lab Results  Component Value Date   TSH 2.93 06/07/2018   Lab Results  Component Value Date   WBC 6.1 06/07/2018   HGB 14.1 06/07/2018   HCT 42.1 06/07/2018   MCV  96.5 06/07/2018   PLT 238.0 06/07/2018   Lab Results  Component Value Date   CREATININE 0.71 06/07/2018   BUN 24 (H) 06/07/2018   NA 141 06/07/2018   K 4.6 06/07/2018   CL 103 06/07/2018   CO2 30 06/07/2018   Lab Results  Component Value Date   ALT 16 06/07/2018   AST 19 06/07/2018   ALKPHOS 50 06/07/2018   BILITOT 1.0 06/07/2018   Lab Results  Component Value Date   CHOL 265 (H) 06/07/2018   Lab Results  Component Value Date   HDL 80.60 06/07/2018   Lab Results  Component Value Date   LDLCALC 174 (H) 06/07/2018   Lab Results  Component Value Date   TRIG 52.0 06/07/2018   Lab Results  Component Value Date   CHOLHDL 3 06/07/2018     ASSESSMENT AND PLAN:   Health maintenance exam: Reviewed age and gender appropriate health maintenance issues (prudent diet, regular exercise, health risks of tobacco and excessive alcohol, use of seatbelts, fire alarms in home, use of sunscreen).  Also reviewed age and gender appropriate health screening as well as vaccine recommendations. Vaccines: Tdap, pneumovax, shingrix, flu--> Pt declines. Labs: fasting HP labs ordered. Cervical ca screening: no further screening indicated. Breast ca screening: next mammogram due 10/2019. Colon ca screening:  Next cologuard 2023. Osteoporosis: repeat DEXA due 10/2020.  An After Visit  Summary was printed and given to the patient.  FOLLOW UP:  Return in about 1 year (around 06/12/2020) for annual CPE (fasting).  Signed:  Santiago Bumpers, MD           06/13/2019

## 2019-06-14 ENCOUNTER — Encounter: Payer: Self-pay | Admitting: Family Medicine

## 2019-06-14 ENCOUNTER — Other Ambulatory Visit (INDEPENDENT_AMBULATORY_CARE_PROVIDER_SITE_OTHER): Payer: BLUE CROSS/BLUE SHIELD

## 2019-06-14 DIAGNOSIS — R7301 Impaired fasting glucose: Secondary | ICD-10-CM

## 2019-06-14 LAB — COMPREHENSIVE METABOLIC PANEL
ALT: 18 U/L (ref 0–35)
AST: 21 U/L (ref 0–37)
Albumin: 4.5 g/dL (ref 3.5–5.2)
Alkaline Phosphatase: 54 U/L (ref 39–117)
BUN: 19 mg/dL (ref 6–23)
CO2: 26 mEq/L (ref 19–32)
Calcium: 9.6 mg/dL (ref 8.4–10.5)
Chloride: 105 mEq/L (ref 96–112)
Creatinine, Ser: 0.72 mg/dL (ref 0.40–1.20)
GFR: 80.78 mL/min (ref >60.00)
Glucose, Bld: 106 mg/dL — ABNORMAL HIGH (ref 70–99)
Potassium: 4.5 mEq/L (ref 3.5–5.1)
Sodium: 140 mEq/L (ref 135–145)
Total Bilirubin: 1 mg/dL (ref 0.2–1.2)
Total Protein: 6.8 g/dL (ref 6.0–8.3)

## 2019-06-14 LAB — LIPID PANEL
Cholesterol: 294 mg/dL — ABNORMAL HIGH (ref 0–200)
HDL: 87.7 mg/dL (ref >39.00)
LDL Cholesterol: 191 mg/dL — ABNORMAL HIGH (ref 0–99)
NonHDL: 206.48
Total CHOL/HDL Ratio: 3
Triglycerides: 77 mg/dL (ref 0.0–149.0)
VLDL: 15.4 mg/dL (ref 0.0–40.0)

## 2019-06-15 ENCOUNTER — Encounter: Payer: Self-pay | Admitting: Family Medicine

## 2019-06-15 ENCOUNTER — Other Ambulatory Visit: Payer: Self-pay

## 2019-06-15 DIAGNOSIS — E7800 Pure hypercholesterolemia, unspecified: Secondary | ICD-10-CM

## 2019-06-15 LAB — HEMOGLOBIN A1C: Hgb A1c MFr Bld: 6.1 % (ref 4.6–6.5)

## 2019-06-22 DIAGNOSIS — M9901 Segmental and somatic dysfunction of cervical region: Secondary | ICD-10-CM | POA: Diagnosis not present

## 2019-06-22 DIAGNOSIS — M9904 Segmental and somatic dysfunction of sacral region: Secondary | ICD-10-CM | POA: Diagnosis not present

## 2019-06-22 DIAGNOSIS — M9902 Segmental and somatic dysfunction of thoracic region: Secondary | ICD-10-CM | POA: Diagnosis not present

## 2019-06-22 DIAGNOSIS — M9903 Segmental and somatic dysfunction of lumbar region: Secondary | ICD-10-CM | POA: Diagnosis not present

## 2019-08-08 DIAGNOSIS — M9901 Segmental and somatic dysfunction of cervical region: Secondary | ICD-10-CM | POA: Diagnosis not present

## 2019-08-08 DIAGNOSIS — M9903 Segmental and somatic dysfunction of lumbar region: Secondary | ICD-10-CM | POA: Diagnosis not present

## 2019-08-08 DIAGNOSIS — M9902 Segmental and somatic dysfunction of thoracic region: Secondary | ICD-10-CM | POA: Diagnosis not present

## 2019-08-08 DIAGNOSIS — M9904 Segmental and somatic dysfunction of sacral region: Secondary | ICD-10-CM | POA: Diagnosis not present

## 2019-11-10 DIAGNOSIS — M9901 Segmental and somatic dysfunction of cervical region: Secondary | ICD-10-CM | POA: Diagnosis not present

## 2019-11-10 DIAGNOSIS — M9904 Segmental and somatic dysfunction of sacral region: Secondary | ICD-10-CM | POA: Diagnosis not present

## 2019-11-10 DIAGNOSIS — M9903 Segmental and somatic dysfunction of lumbar region: Secondary | ICD-10-CM | POA: Diagnosis not present

## 2019-11-10 DIAGNOSIS — M9902 Segmental and somatic dysfunction of thoracic region: Secondary | ICD-10-CM | POA: Diagnosis not present

## 2019-11-28 IMAGING — MG DIGITAL SCREENING BILATERAL MAMMOGRAM WITH CAD
4 series · 4 of 4 positions shown · non-contrast
Comparison: Previous exam(s).

CLINICAL DATA: Screening.

EXAM:
DIGITAL SCREENING BILATERAL MAMMOGRAM WITH CAD

[L MLO]
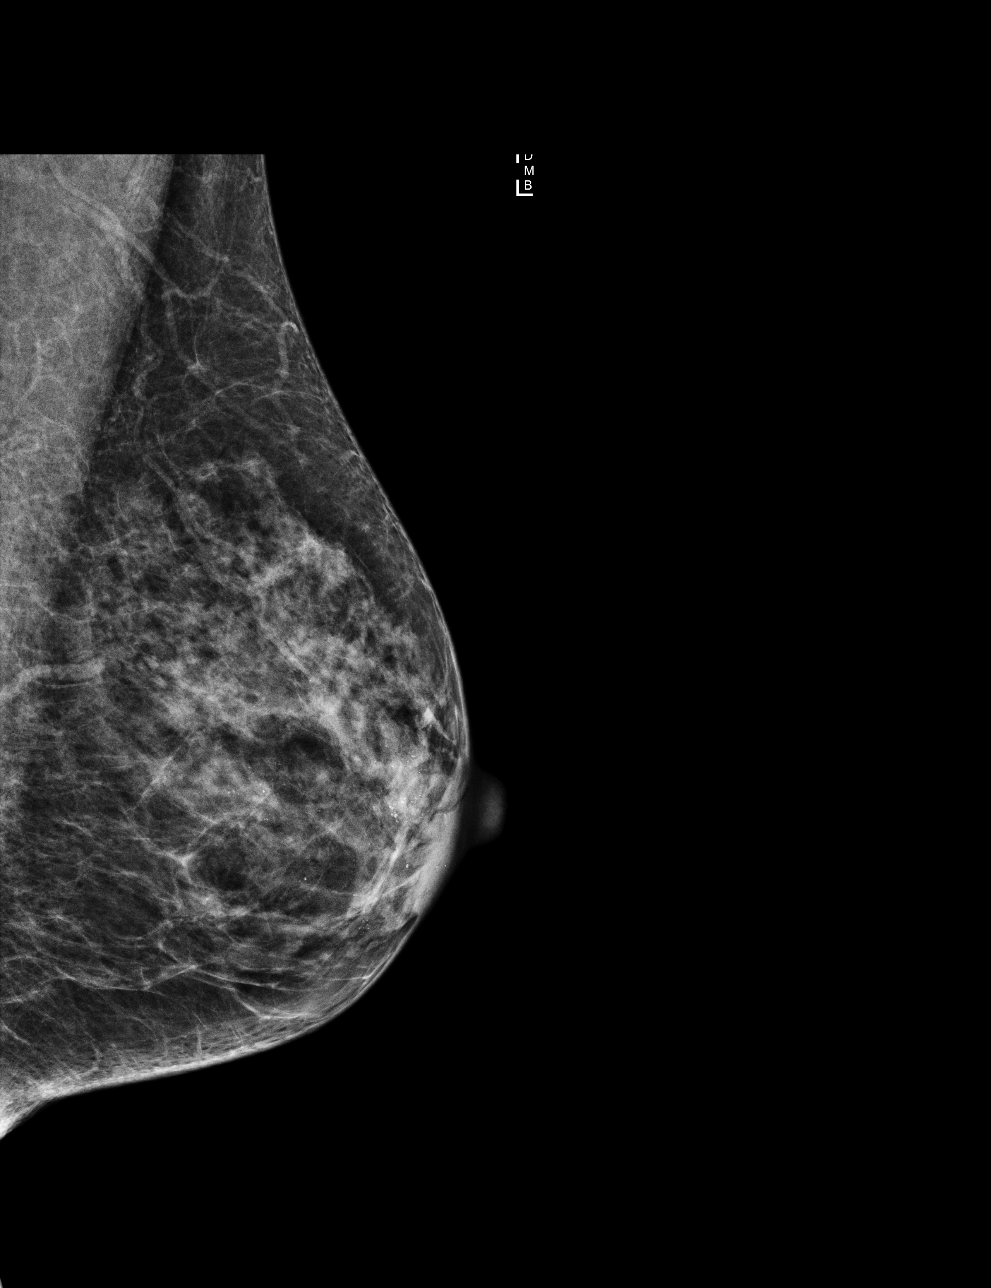

[L CC]
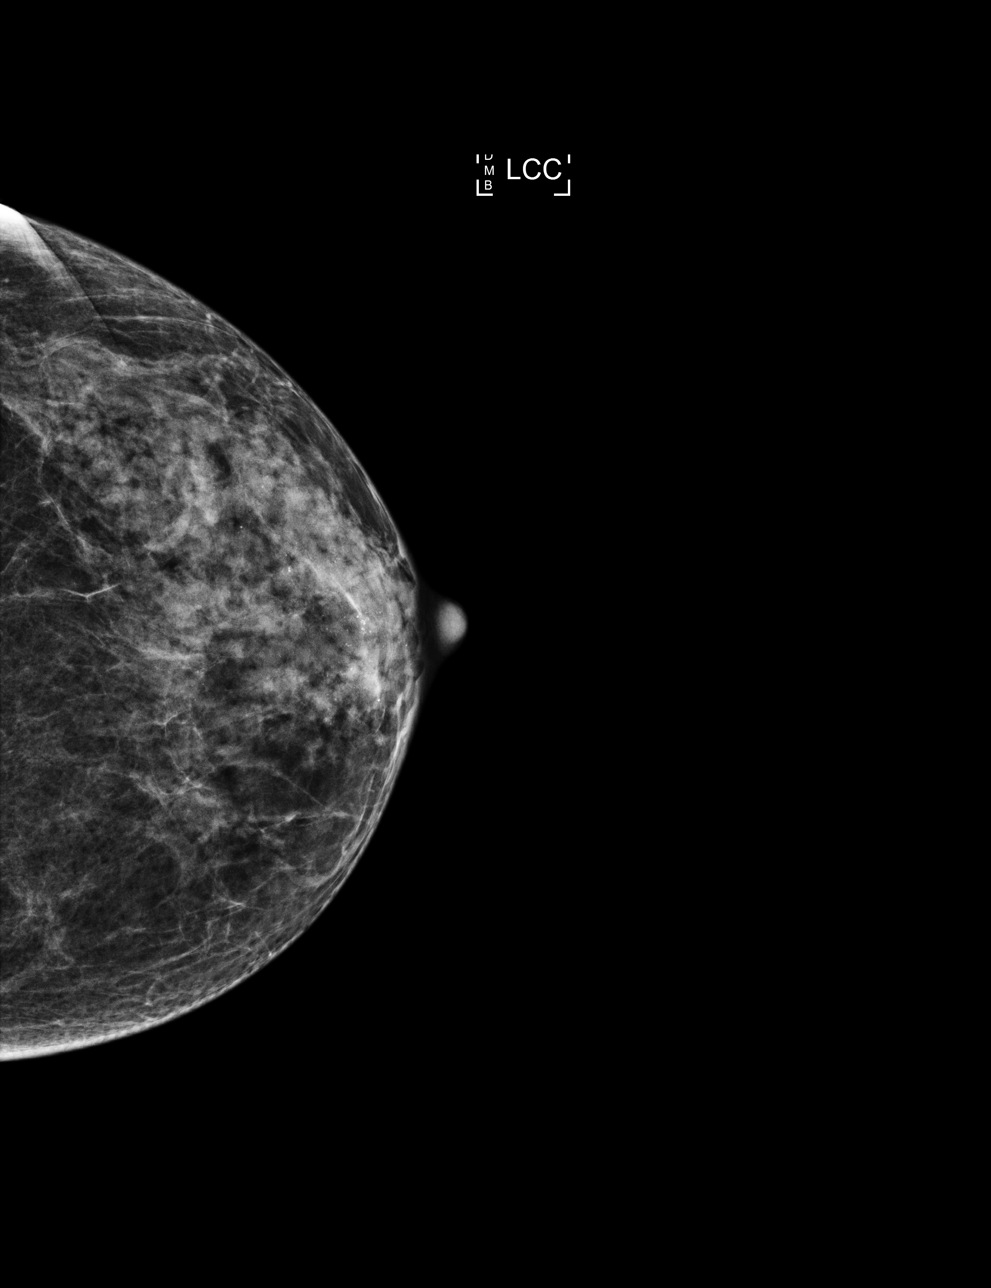

[R MLO]
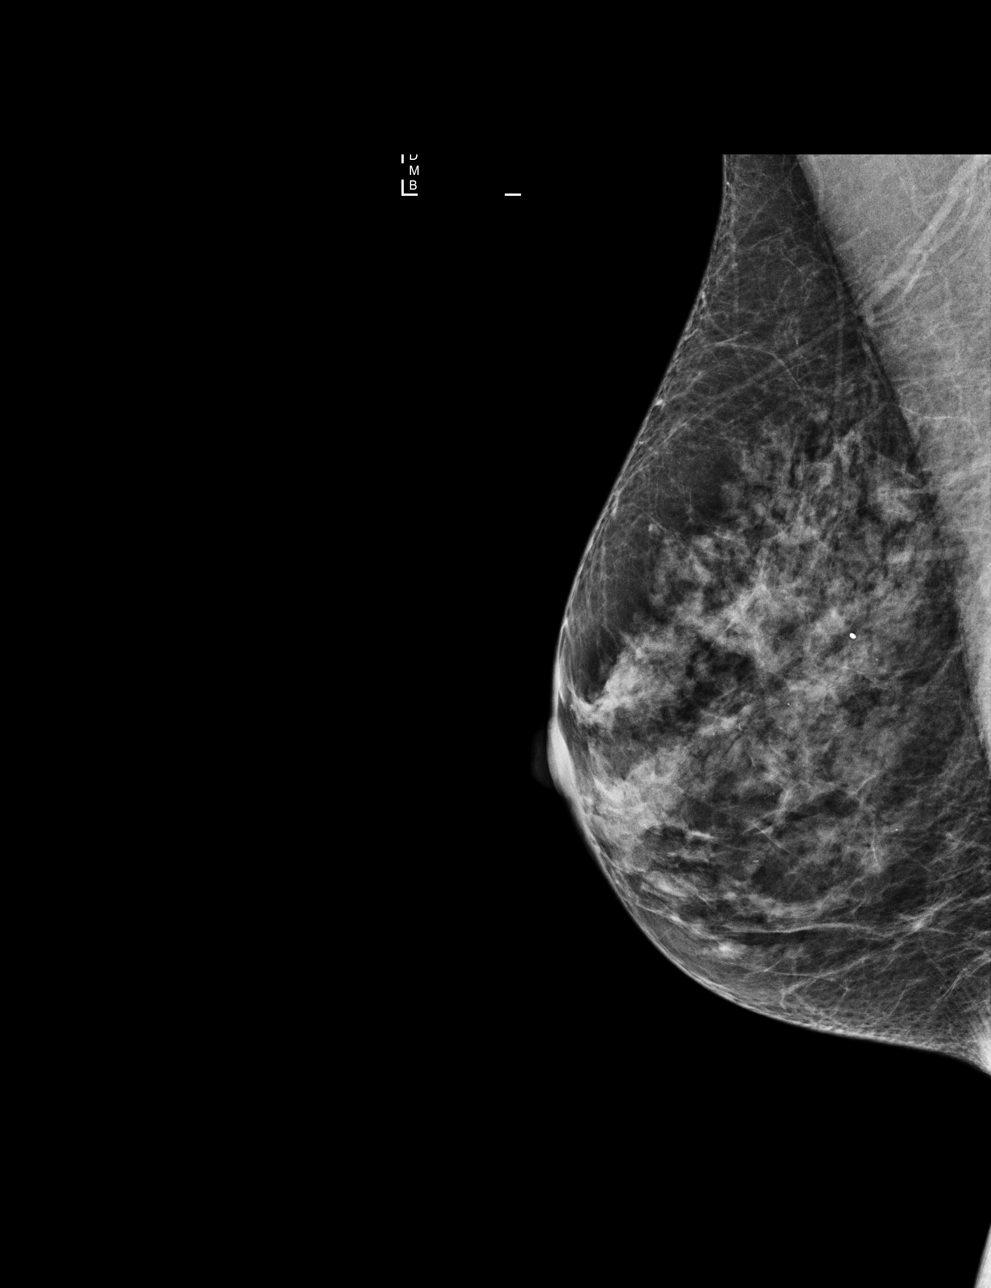

[R CC]
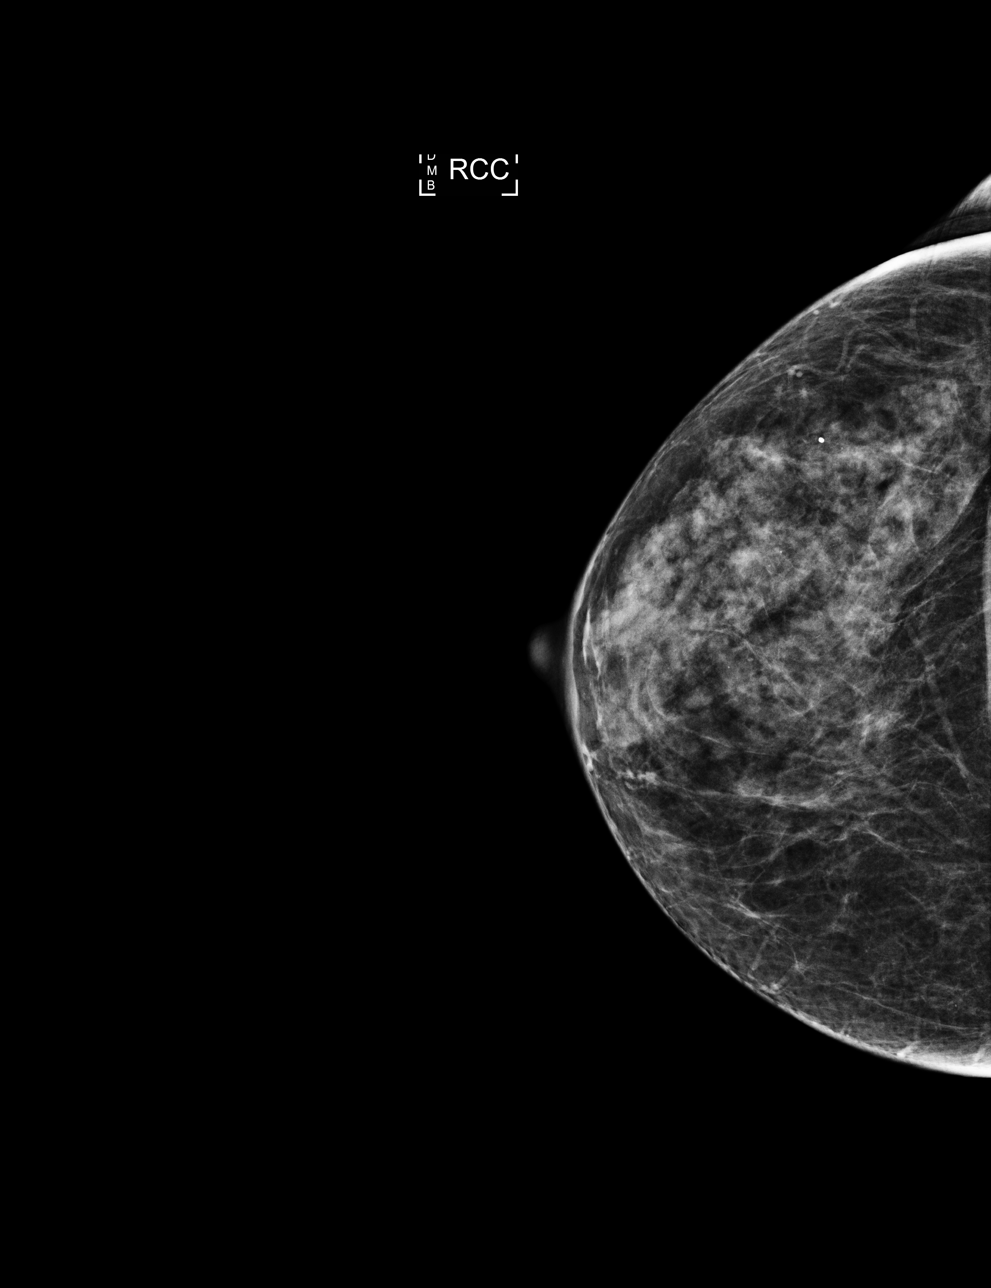

[4 of 4 positions shown; findings below may reference images not displayed]

ACR Breast Density Category c: The breast tissue is heterogeneously
dense, which may obscure small masses.
FINDINGS: There are no findings suspicious for malignancy. Images were
processed with CAD.
IMPRESSION: No mammographic evidence of malignancy. A result letter of this
screening mammogram will be mailed directly to the patient.

RECOMMENDATION:
Screening mammogram in one year. (Code:[id number])

BI-RADS CATEGORY  1: Negative.

## 2019-12-06 DIAGNOSIS — M9901 Segmental and somatic dysfunction of cervical region: Secondary | ICD-10-CM | POA: Diagnosis not present

## 2019-12-06 DIAGNOSIS — M9903 Segmental and somatic dysfunction of lumbar region: Secondary | ICD-10-CM | POA: Diagnosis not present

## 2019-12-06 DIAGNOSIS — M47813 Spondylosis without myelopathy or radiculopathy, cervicothoracic region: Secondary | ICD-10-CM | POA: Diagnosis not present

## 2019-12-06 DIAGNOSIS — M9902 Segmental and somatic dysfunction of thoracic region: Secondary | ICD-10-CM | POA: Diagnosis not present

## 2019-12-06 DIAGNOSIS — M4724 Other spondylosis with radiculopathy, thoracic region: Secondary | ICD-10-CM | POA: Diagnosis not present

## 2019-12-06 DIAGNOSIS — M9904 Segmental and somatic dysfunction of sacral region: Secondary | ICD-10-CM | POA: Diagnosis not present

## 2019-12-06 DIAGNOSIS — M5116 Intervertebral disc disorders with radiculopathy, lumbar region: Secondary | ICD-10-CM | POA: Diagnosis not present

## 2019-12-06 DIAGNOSIS — M4723 Other spondylosis with radiculopathy, cervicothoracic region: Secondary | ICD-10-CM | POA: Diagnosis not present

## 2019-12-06 DIAGNOSIS — M47814 Spondylosis without myelopathy or radiculopathy, thoracic region: Secondary | ICD-10-CM | POA: Diagnosis not present

## 2019-12-06 DIAGNOSIS — M47816 Spondylosis without myelopathy or radiculopathy, lumbar region: Secondary | ICD-10-CM | POA: Diagnosis not present

## 2019-12-23 DIAGNOSIS — R030 Elevated blood-pressure reading, without diagnosis of hypertension: Secondary | ICD-10-CM | POA: Diagnosis not present

## 2019-12-23 DIAGNOSIS — Z833 Family history of diabetes mellitus: Secondary | ICD-10-CM | POA: Diagnosis not present

## 2019-12-23 DIAGNOSIS — Z823 Family history of stroke: Secondary | ICD-10-CM | POA: Diagnosis not present

## 2019-12-23 DIAGNOSIS — Z8249 Family history of ischemic heart disease and other diseases of the circulatory system: Secondary | ICD-10-CM | POA: Diagnosis not present

## 2020-02-01 DIAGNOSIS — M47814 Spondylosis without myelopathy or radiculopathy, thoracic region: Secondary | ICD-10-CM | POA: Diagnosis not present

## 2020-02-01 DIAGNOSIS — M9901 Segmental and somatic dysfunction of cervical region: Secondary | ICD-10-CM | POA: Diagnosis not present

## 2020-02-01 DIAGNOSIS — M47813 Spondylosis without myelopathy or radiculopathy, cervicothoracic region: Secondary | ICD-10-CM | POA: Diagnosis not present

## 2020-02-01 DIAGNOSIS — M5116 Intervertebral disc disorders with radiculopathy, lumbar region: Secondary | ICD-10-CM | POA: Diagnosis not present

## 2020-02-01 DIAGNOSIS — M4723 Other spondylosis with radiculopathy, cervicothoracic region: Secondary | ICD-10-CM | POA: Diagnosis not present

## 2020-02-01 DIAGNOSIS — M4724 Other spondylosis with radiculopathy, thoracic region: Secondary | ICD-10-CM | POA: Diagnosis not present

## 2020-02-01 DIAGNOSIS — M9903 Segmental and somatic dysfunction of lumbar region: Secondary | ICD-10-CM | POA: Diagnosis not present

## 2020-02-01 DIAGNOSIS — M47816 Spondylosis without myelopathy or radiculopathy, lumbar region: Secondary | ICD-10-CM | POA: Diagnosis not present

## 2020-02-01 DIAGNOSIS — M9904 Segmental and somatic dysfunction of sacral region: Secondary | ICD-10-CM | POA: Diagnosis not present

## 2020-02-01 DIAGNOSIS — M9902 Segmental and somatic dysfunction of thoracic region: Secondary | ICD-10-CM | POA: Diagnosis not present

## 2020-02-27 DIAGNOSIS — M9902 Segmental and somatic dysfunction of thoracic region: Secondary | ICD-10-CM | POA: Diagnosis not present

## 2020-02-27 DIAGNOSIS — M9901 Segmental and somatic dysfunction of cervical region: Secondary | ICD-10-CM | POA: Diagnosis not present

## 2020-02-27 DIAGNOSIS — M5116 Intervertebral disc disorders with radiculopathy, lumbar region: Secondary | ICD-10-CM | POA: Diagnosis not present

## 2020-02-27 DIAGNOSIS — M9904 Segmental and somatic dysfunction of sacral region: Secondary | ICD-10-CM | POA: Diagnosis not present

## 2020-02-27 DIAGNOSIS — M47816 Spondylosis without myelopathy or radiculopathy, lumbar region: Secondary | ICD-10-CM | POA: Diagnosis not present

## 2020-02-27 DIAGNOSIS — M4723 Other spondylosis with radiculopathy, cervicothoracic region: Secondary | ICD-10-CM | POA: Diagnosis not present

## 2020-02-27 DIAGNOSIS — M47814 Spondylosis without myelopathy or radiculopathy, thoracic region: Secondary | ICD-10-CM | POA: Diagnosis not present

## 2020-02-27 DIAGNOSIS — M47813 Spondylosis without myelopathy or radiculopathy, cervicothoracic region: Secondary | ICD-10-CM | POA: Diagnosis not present

## 2020-02-27 DIAGNOSIS — M4724 Other spondylosis with radiculopathy, thoracic region: Secondary | ICD-10-CM | POA: Diagnosis not present

## 2020-02-27 DIAGNOSIS — M9903 Segmental and somatic dysfunction of lumbar region: Secondary | ICD-10-CM | POA: Diagnosis not present

## 2020-03-21 DIAGNOSIS — R42 Dizziness and giddiness: Secondary | ICD-10-CM

## 2020-03-21 HISTORY — DX: Dizziness and giddiness: R42

## 2020-03-29 DIAGNOSIS — M9902 Segmental and somatic dysfunction of thoracic region: Secondary | ICD-10-CM | POA: Diagnosis not present

## 2020-03-29 DIAGNOSIS — M47816 Spondylosis without myelopathy or radiculopathy, lumbar region: Secondary | ICD-10-CM | POA: Diagnosis not present

## 2020-03-29 DIAGNOSIS — M4723 Other spondylosis with radiculopathy, cervicothoracic region: Secondary | ICD-10-CM | POA: Diagnosis not present

## 2020-03-29 DIAGNOSIS — M9901 Segmental and somatic dysfunction of cervical region: Secondary | ICD-10-CM | POA: Diagnosis not present

## 2020-03-29 DIAGNOSIS — M47814 Spondylosis without myelopathy or radiculopathy, thoracic region: Secondary | ICD-10-CM | POA: Diagnosis not present

## 2020-03-29 DIAGNOSIS — M47813 Spondylosis without myelopathy or radiculopathy, cervicothoracic region: Secondary | ICD-10-CM | POA: Diagnosis not present

## 2020-03-29 DIAGNOSIS — M4724 Other spondylosis with radiculopathy, thoracic region: Secondary | ICD-10-CM | POA: Diagnosis not present

## 2020-03-29 DIAGNOSIS — M5116 Intervertebral disc disorders with radiculopathy, lumbar region: Secondary | ICD-10-CM | POA: Diagnosis not present

## 2020-03-29 DIAGNOSIS — M9904 Segmental and somatic dysfunction of sacral region: Secondary | ICD-10-CM | POA: Diagnosis not present

## 2020-03-29 DIAGNOSIS — M9903 Segmental and somatic dysfunction of lumbar region: Secondary | ICD-10-CM | POA: Diagnosis not present

## 2020-04-11 ENCOUNTER — Other Ambulatory Visit: Payer: Self-pay

## 2020-04-11 ENCOUNTER — Encounter (HOSPITAL_COMMUNITY): Payer: Self-pay | Admitting: Emergency Medicine

## 2020-04-11 ENCOUNTER — Telehealth: Payer: Self-pay | Admitting: Family Medicine

## 2020-04-11 ENCOUNTER — Emergency Department (HOSPITAL_COMMUNITY): Payer: Medicare HMO

## 2020-04-11 ENCOUNTER — Emergency Department (HOSPITAL_COMMUNITY)
Admission: EM | Admit: 2020-04-11 | Discharge: 2020-04-12 | Disposition: A | Discharge status: Home / Self Care | Payer: Medicare HMO | Attending: Emergency Medicine | Admitting: Emergency Medicine

## 2020-04-11 DIAGNOSIS — R42 Dizziness and giddiness: Secondary | ICD-10-CM

## 2020-04-11 LAB — COMPREHENSIVE METABOLIC PANEL
ALT: 19 U/L (ref 0–44)
AST: 19 U/L (ref 15–41)
Albumin: 4.4 g/dL (ref 3.5–5.0)
Alkaline Phosphatase: 52 U/L (ref 38–126)
Anion gap: 11 (ref 5–15)
BUN: 26 mg/dL — ABNORMAL HIGH (ref 8–23)
CO2: 26 mmol/L (ref 22–32)
Calcium: 9.2 mg/dL (ref 8.9–10.3)
Chloride: 104 mmol/L (ref 98–111)
Creatinine, Ser: 0.6 mg/dL (ref 0.44–1.00)
GFR, Estimated: >60 mL/min (ref >60)
Glucose, Bld: 154 mg/dL — ABNORMAL HIGH (ref 70–99)
Potassium: 4 mmol/L (ref 3.5–5.1)
Sodium: 141 mmol/L (ref 135–145)
Total Bilirubin: 1.2 mg/dL (ref 0.3–1.2)
Total Protein: 7.2 g/dL (ref 6.5–8.1)

## 2020-04-11 LAB — URINALYSIS, ROUTINE W REFLEX MICROSCOPIC
Bilirubin Urine: NEGATIVE
Glucose, UA: 50 mg/dL — AB
Hgb urine dipstick: NEGATIVE
Ketones, ur: 20 mg/dL — AB
Leukocytes,Ua: NEGATIVE
Nitrite: NEGATIVE
Protein, ur: NEGATIVE mg/dL
Specific Gravity, Urine: 1.021 (ref 1.005–1.030)
pH: 6 (ref 5.0–8.0)

## 2020-04-11 LAB — CBC
HCT: 41.1 % (ref 36.0–46.0)
Hemoglobin: 13.4 g/dL (ref 12.0–15.0)
MCH: 31.8 pg (ref 26.0–34.0)
MCHC: 32.6 g/dL (ref 30.0–36.0)
MCV: 97.6 fL (ref 80.0–100.0)
Platelets: 232 10*3/uL (ref 150–400)
RBC: 4.21 MIL/uL (ref 3.87–5.11)
RDW: 12.2 % (ref 11.5–15.5)
WBC: 7 10*3/uL (ref 4.0–10.5)
nRBC: 0 % (ref 0.0–0.2)

## 2020-04-11 MED ORDER — MECLIZINE HCL 25 MG PO TABS: 25.0000 mg | ORAL_TABLET | Freq: Three times a day (TID) | ORAL | 0 refills | Status: AC | PRN

## 2020-04-11 MED ORDER — SODIUM CHLORIDE 0.9 % IV BOLUS
1000.0000 mL | Freq: Once | INTRAVENOUS | Status: AC
  Administered 2020-04-11: 1000 mL via INTRAVENOUS

## 2020-04-11 MED ORDER — MECLIZINE HCL 25 MG PO TABS
25.0000 mg | ORAL_TABLET | Freq: Once | ORAL | Status: AC
  Administered 2020-04-11: 25 mg via ORAL
  Filled 2020-04-11: qty 1

## 2020-04-11 MED ORDER — ACETAMINOPHEN 325 MG PO TABS
650.0000 mg | ORAL_TABLET | Freq: Once | ORAL | Status: AC
  Administered 2020-04-12: 650 mg via ORAL
  Filled 2020-04-11: qty 2

## 2020-04-11 NOTE — Telephone Encounter (Signed)
Chart note: Patient called stating she had "severe vertigo causing nausea." I transferred patient to triage nurse. Patient called back to make [place] aware she is on her way to [facility] emergency department.

## 2020-04-11 NOTE — Telephone Encounter (Signed)
Noted  

## 2020-04-11 NOTE — ED Triage Notes (Signed)
Patient states she has been having severe vertigo from the time she woke up this morning, emesis x3, nausea continues. If she turns her head the vertigo gets bad, it is okay is she does not move her head.

## 2020-04-11 NOTE — ED Provider Notes (Signed)
[facility] Provider Note   CSN: [id number] Arrival date & time: 04/11/20  1547     History Chief Complaint  Patient presents with  . Dizziness    Katherine Villanueva is a 69 y.o. female presenting to the emergency department with room spinning dizziness that began earlier this morning.  She states she woke around 3 AM to use the restroom she states immediately she noticed her dizziness.  She states she had a call to the restroom and crawl back to her bed.  She proceeded to have dizziness throughout the day that was brought on by position change.  She began having nausea and vomiting due to the dizziness.  She reports gradual onset of a mild headache that began this evening, which she attributes to dehydration.  She is having no other neurologic deficits including no vision change, numbness, weakness, facial droop, slurred speech.  She had similar occurrence of vertigo a few years ago however it lasted for only a few hours during the day and resolved after a nap.  She states she noticed some slight dizziness yesterday after she was blow drying her hair she had her head upside down and when she stood up quickly she felt a small amount of dizziness at the time.  Of note, she reports many years back she developed a constant tinnitus. 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 She doesn't lateralize the tinnitus or hearing loss.   The history is provided by the patient.       Past Medical History:  Diagnosis Date  . Colon cancer screening 05/2018   Cologuard neg 05/2018; repeat 2023.  [name] GERD (gastroesophageal reflux disease)   . History of kidney stones   . Hypercholesterolemia 02/2017   LDL > 200.  Statin recommended.  Pt chose TLC and LDL decreased to 130 in 2 months.  Rpt 05/2018: LDL 170s, HDL 80->Fram rx = 4.5%->continue TLC. Rec'd statin 05/2019.  [name] Left inguinal hernia 11/2016    Watchful waiting approach  . Osteoporosis 11/04/2018   T score -3.2; recommended fosamax 10/2018.  [name] Prediabetes 05/2019   A1c 6.1%.  . Rheumatic fever     There are no problems to display for this patient.   Past Surgical History:  Procedure Laterality Date  . COLONOSCOPY     X 2.  Most recent ? 20014?  No abnormals.  [name] DEXA  11/04/2018   T score -3.2 spine  . LAPAROSCOPY  1981   "for possible infertility"  . TONSILLECTOMY  1960     OB History   No obstetric history on file.     Family History  Problem Relation Age of Onset  . Arthritis Mother   . Stroke Mother   . Kidney disease Mother   . Hyperlipidemia Father   . Heart disease Father   . Diabetes Father   . Stroke Brother   . Galactosemia Son   . Lymphoma Paternal Grandmother   . Diabetes Paternal Grandfather     Social History   Tobacco Use  . Smoking status: Never Smoker  . Smokeless tobacco: Never Used  Vaping Use  . Vaping Use: Never used  Substance Use Topics  . Alcohol use: Yes    Alcohol/week: 3.0 standard drinks    Types: 3 Glasses of wine per week  . Drug use: No    Home Medications Prior to Admission medications   Medication Sig Start  Date End Date Taking? Authorizing Provider  acetaminophen (TYLENOL) 650 MG CR tablet Take 650 mg by mouth every 8 (eight) hours as needed for pain.   Yes [provider]  ALFALFA PO Take 1 tablet by mouth 2 (two) times daily.   Yes [provider]  Ascorbic Acid (VITAMIN C PO) Take 1 tablet by mouth 2 (two) times daily.   Yes [provider]  BIOTIN PO Take 1 tablet by mouth daily.   Yes [provider]  Calcium-Magnesium-Vitamin D (CALCIUM MAGNESIUM PO) Take 1 tablet by mouth daily.   Yes [provider]  Cyanocobalamin (B-12 PO) Take 1 tablet by mouth daily.   Yes [provider]  HORSE CHESTNUT PO Take 1 tablet by mouth 2 (two) times daily.   Yes [provider]  Iodine Strong, [place], (IODINE  STRONG PO) Take 1 tablet by mouth daily.   Yes [provider]  Levomefolate Glucosamine (METHYLFOLATE PO) Take 1 tablet by mouth daily.   Yes [provider]  Pyridoxine HCl (B-6 PO) Take 1 tablet by mouth daily.   Yes [provider]  TURMERIC CURCUMIN PO Take 2 tablets by mouth daily.   Yes [provider]  VITAMIN D-VITAMIN K PO Take 1 tablet by mouth daily.   Yes [provider]  meclizine (ANTIVERT) 25 MG tablet Take 1 tablet (25 mg total) by mouth 3 (three) times daily as needed for dizziness. 04/11/20   Clydie Dillen, [place] N, PA-C    Allergies    Patient has no known allergies.  Review of Systems   Review of Systems  Gastrointestinal: Positive for nausea and vomiting.  Neurological: Positive for dizziness.  All other systems reviewed and are negative.   Physical Exam Updated Vital Signs BP 125/69   Pulse 69   Temp 97.8 F (36.6 C) (Oral)   Resp 19   LMP  (LMP Unknown)   SpO2 100%   Physical Exam Vitals and nursing note reviewed.  Constitutional:      Appearance: She is well-developed and well-nourished.  HENT:     Head: Normocephalic and atraumatic.  Eyes:     Extraocular Movements: Extraocular movements intact.     Conjunctiva/sclera: Conjunctivae normal.     Pupils: Pupils are equal, round, and reactive to light.  Cardiovascular:     Rate and Rhythm: Normal rate and regular rhythm.  Pulmonary:     Effort: Pulmonary effort is normal. No respiratory distress.     Breath sounds: Normal breath sounds.  Abdominal:     General: Bowel sounds are normal.     Palpations: Abdomen is soft.     Tenderness: There is no abdominal tenderness.  Musculoskeletal:     Cervical back: Normal range of motion and neck supple.  Skin:    General: Skin is warm.  Neurological:     Mental Status: She is alert.     Comments: Mental Status:  Alert, oriented, thought content appropriate, able to give a coherent history. Speech fluent without  evidence of aphasia. Able to follow 2 step commands without difficulty.  Cranial Nerves:  II:  pupils equal, round, reactive to light III,IV, VI: ptosis not present, extra-ocular motions intact bilaterally  V,VII: smile symmetric, facial light touch sensation equal VIII: hearing grossly normal to voice  X: uvula elevates symmetrically  XI: bilateral shoulder shrug symmetric and strong XII: midline tongue extension without fassiculations Motor:  Normal tone. 5/5 strength in upper and lower extremities bilaterally including strong and equal  grip strength and dorsiflexion/plantar flexion Sensory: grossly normal in all extremities.  Cerebellar: normal finger-to-nose with bilateral upper extremities Gait: deferred d/t dizziness CV: distal pulses palpable throughout    Psychiatric:        Mood and Affect: Mood and affect normal.        Behavior: Behavior normal.     ED Results / Procedures / Treatments   Labs (all labs ordered are listed, but only abnormal results are displayed) Labs Reviewed  COMPREHENSIVE METABOLIC PANEL - Abnormal; Notable for the following components:      Result Value   Glucose, Bld 154 (*)    BUN 26 (*)    All other components within normal limits  URINALYSIS, ROUTINE W REFLEX MICROSCOPIC - Abnormal; Notable for the following components:   Glucose, UA 50 (*)    Ketones, ur 20 (*)    All other components within normal limits  CBC    EKG EKG Interpretation  Date/Time:  Wednesday April 11 2020 19:38:57 EST Ventricular Rate:  57 PR Interval:    QRS Duration: 168 QT Interval:  435 QTC Calculation: 424 R Axis:   80 Text Interpretation: Sinus rhythm Borderline short PR interval Nonspecific intraventricular conduction delay NSR, RSR pattern v1-3 Confirmed by Coralee Pesa [phone]) on 04/11/2020 10:56:30 PM   Radiology CT Head Wo Contrast  Result Date: 04/11/2020 CLINICAL DATA:  69 year old female with dizziness. EXAM: CT HEAD WITHOUT CONTRAST  TECHNIQUE: Contiguous axial images were obtained from the base of the skull through the vertex without intravenous contrast. COMPARISON:  None. FINDINGS: Brain: The ventricles and sulci appropriate size for patient's age. The gray-white matter discrimination is preserved. There is no acute intracranial hemorrhage. No mass effect or midline shift. No extra-axial fluid collection. Vascular: No hyperdense vessel or unexpected calcification. Skull: Normal. Negative for fracture or focal lesion. Sinuses/Orbits: No acute finding. Other: None IMPRESSION: Unremarkable noncontrast CT of the brain. Electronically Signed   By: Elgie Collard M.D.   On: 04/11/2020 21:02    Procedures Procedures (including critical care time)  Medications Ordered in ED Medications  acetaminophen (TYLENOL) tablet 650 mg (has no administration in time range)  meclizine (ANTIVERT) tablet 25 mg (25 mg Oral Given 04/11/20 2042)  sodium chloride 0.9 % bolus 1,000 mL (1,000 mLs Intravenous New Bag/Given 04/11/20 2049)    ED Course  I have reviewed the triage vital signs and the nursing notes.  Pertinent labs & imaging results that were available during my care of the patient were reviewed by me and considered in my medical decision making (see chart for details).  Clinical Course as of 04/11/20 2335  Wed Apr 11, 2020  2302 Patient reevaluated, reports she feels much better. She is able to stand and ambulate with steady gait and no recurrent dizziness. Discussed reassuring work-up. Presentation seems most consistent with a peripheral vertigo. Patient will be discharged with instruction to follow closely with PCP, she is also provided with ENT referral. [JR]    Clinical Course User Index [JR] Athalene Kolle, [place] N, PA-C   MDM Rules/Calculators/A&P                          Patient presenting with positional vertigo began early this morning.  She has minimal to no symptoms at rest.  She has been having vomiting as well.  She  reports similar episode a few years back though only lasted a few hours during the day.  She has no other neurologic  symptoms or focal neuro deficits on exam.  No vertical or rotational nystagmus.  CT scan is negative.  Laboratory work-up is unremarkable.  She is treated with meclizine with significant improvement in symptoms.  She is now standing and ambulating with steady gait and no recurrent symptoms.    Patient has longstanding history of tinnitus and gradual onset of hearing loss.  Question whether she is developing Mnire's?  doubt CVA or other central cause of vertigo.  History and physical consistent with peripheral vertigo symptoms.  Discussed reassuring work-up and plan for discharge with meclizine for symptom relief.  Patient verbalized understanding and agrees with this plan.  She is given ENT referral and instructed to follow closely with PCP.  She is instructed of strict return precautions and importance of follow-up.  Patient discharged in no distress.  Discussed results, findings, treatment and follow up. Patient advised of return precautions. Patient verbalized understanding and agreed with plan.  Patient workup and care plan discussed with attending Dr. Hyman Bower.  Final Clinical Impression(s) / ED Diagnoses Final diagnoses:  Vertigo    Rx / DC Orders ED Discharge Orders         Ordered    meclizine (ANTIVERT) 25 MG tablet  3 times daily PRN        04/11/20 2321           Denisse Whitenack, [place] N, PA-C 04/11/20 2339    Rozelle Logan, DO 04/11/20 2353

## 2020-04-11 NOTE — Discharge Instructions (Addendum)
You can take the meclizine every 8 hours as needed for dizziness. Follow closely with your primary care provider and schedule appointment with the ENT specialist. Return to the emergency department if you develop severe headache, persistent dizziness, uncontrollable vomiting, new numbness or weakness in your extremities or face, difficulty speaking, or other concerning symptoms.

## 2020-04-11 NOTE — Telephone Encounter (Signed)
FYI  Please see below

## 2020-04-12 NOTE — Telephone Encounter (Signed)
PCP already notified. Added for chart record  [facility] Day - Client TELEPHONE ADVICE RECORD [facility] Patient Name: Katherine Villanueva Gender: Female DOB: 11/09/1950 Age: 69 Y 10 M 24 D Return Phone Number: [phone] (Primary) Address: City/State/Zip: [place] [address] Client [facility] Day - Client Client Site [facility] - Day Physician Santiago Bumpers - MD Contact Type Call Who Is Calling Patient / Member / Family / Caregiver Call Type Triage / Clinical Relationship To Patient Self Return Phone Number [phone] (Primary) Chief Complaint Dizziness Reason for Call Symptomatic / Request for Health Information Initial Comment Caller has vertigo and vomiting. She has urinated recently. Translation No Nurse Assessment Nurse: Cox, RN, [place] Date/Time (Eastern Time): 04/11/2020 4:13:25 PM Confirm and document reason for call. If symptomatic, describe symptoms. ---Caller states she has vertigo and has been vomiting. Started today. Does the patient have any new or worsening symptoms? ---Yes Will Villanueva triage be completed? ---Yes Related visit to physician within the last 2 weeks? ---No Does the PT have any chronic conditions? (i.e. diabetes, asthma, this includes High risk factors for pregnancy, etc.) ---Yes List chronic conditions. ---possible pre-diabetic, did not go for testing Is this Villanueva behavioral health or substance abuse call? ---No Disp. Time Lamount Cohen Time) Disposition Final User 04/11/2020 2:19:57 PM Attempt made - line busy [place], RN, Riley Lam 04/11/2020 2:37:36 PM Send To RN Personal Suezanne Jacquet, RN, Riley Lam 04/11/2020 2:57:48 PM Attempt made - line busy Dew, RN, Marylene Land 04/11/2020 3:22:45 PM FINAL ATTEMPT MADE - no message left Cox, RN, [place] 04/11/2020 3:23:08 PM Send To RN Personal Cox, RN, [place] 04/11/2020 3:23:38 PM Send To RN Personal Cox, RN, [place] 04/11/2020 3:57:45 PM Attempt made - no message  left Cox, RN, [place] 04/11/2020 4:16:22 PM Clinical Call Yes Cox, RN, [place] PLEASE NOTE: All timestamps contained within this report are represented as [place] Standard Time. CONFIDENTIALTY NOTICE: This fax transmission is intended only for the addressee. It contains information that is legally privileged, confidential or otherwise protected from use or disclosure. If you are not the intended recipient, you are strictly prohibited from reviewing, disclosing, copying using or disseminating any of this information or taking any action in reliance on or regarding this information. If you have received this fax in error, please notify us immediately by telephone so that we can arrange for its return to [place]. Phone: [phone], Toll-Free: [phone], Fax: [phone] Page: 2 of 2 Call Id: [id number] Comments User: Fayette Pho, RN Date/Time Lamount Cohen Time): 04/11/2020 3:54:48 PM correct number is [phone] User: Fayette Pho, RN Date/Time Lamount Cohen Time): 04/11/2020 4:16:05 PM at ER now

## 2020-04-23 ENCOUNTER — Telehealth: Payer: Self-pay | Admitting: Family Medicine

## 2020-04-23 NOTE — Telephone Encounter (Signed)
Patient advised and voiced understanding.  

## 2020-04-23 NOTE — Telephone Encounter (Signed)
Please advise if patient ok to wait until 06/13/20, thanks.

## 2020-04-23 NOTE — Telephone Encounter (Signed)
Plan of seeing me 06/13/20 is good, does not need to come see me sooner.

## 2020-04-23 NOTE — Telephone Encounter (Signed)
Patient was in ED 04/11/20 for dizziness and was told to followup with her PCP. They prescribed her Meclizine in case of dizziness but she has not needed to take it. Patient states she has appt scheduled with her ENT 05/11/20 to address this problem and also has physical scheduled with Dr. Milinda Cave 06/13/20. She is wondering if she really needs to see Dr. Milinda Cave before then. Please call patient to advise.

## 2020-05-01 DIAGNOSIS — M9904 Segmental and somatic dysfunction of sacral region: Secondary | ICD-10-CM | POA: Diagnosis not present

## 2020-05-01 DIAGNOSIS — M47813 Spondylosis without myelopathy or radiculopathy, cervicothoracic region: Secondary | ICD-10-CM | POA: Diagnosis not present

## 2020-05-01 DIAGNOSIS — M47816 Spondylosis without myelopathy or radiculopathy, lumbar region: Secondary | ICD-10-CM | POA: Diagnosis not present

## 2020-05-01 DIAGNOSIS — M9902 Segmental and somatic dysfunction of thoracic region: Secondary | ICD-10-CM | POA: Diagnosis not present

## 2020-05-01 DIAGNOSIS — M4723 Other spondylosis with radiculopathy, cervicothoracic region: Secondary | ICD-10-CM | POA: Diagnosis not present

## 2020-05-01 DIAGNOSIS — M47814 Spondylosis without myelopathy or radiculopathy, thoracic region: Secondary | ICD-10-CM | POA: Diagnosis not present

## 2020-05-01 DIAGNOSIS — M9903 Segmental and somatic dysfunction of lumbar region: Secondary | ICD-10-CM | POA: Diagnosis not present

## 2020-05-01 DIAGNOSIS — M4724 Other spondylosis with radiculopathy, thoracic region: Secondary | ICD-10-CM | POA: Diagnosis not present

## 2020-05-01 DIAGNOSIS — M9901 Segmental and somatic dysfunction of cervical region: Secondary | ICD-10-CM | POA: Diagnosis not present

## 2020-05-01 DIAGNOSIS — M5116 Intervertebral disc disorders with radiculopathy, lumbar region: Secondary | ICD-10-CM | POA: Diagnosis not present

## 2020-05-11 DIAGNOSIS — H9193 Unspecified hearing loss, bilateral: Secondary | ICD-10-CM | POA: Diagnosis not present

## 2020-05-11 DIAGNOSIS — H9313 Tinnitus, bilateral: Secondary | ICD-10-CM | POA: Diagnosis not present

## 2020-05-11 DIAGNOSIS — H903 Sensorineural hearing loss, bilateral: Secondary | ICD-10-CM | POA: Diagnosis not present

## 2020-05-11 DIAGNOSIS — R42 Dizziness and giddiness: Secondary | ICD-10-CM | POA: Diagnosis not present

## 2020-05-15 ENCOUNTER — Other Ambulatory Visit: Payer: Self-pay | Admitting: Otolaryngology

## 2020-05-15 DIAGNOSIS — H9313 Tinnitus, bilateral: Secondary | ICD-10-CM

## 2020-05-15 DIAGNOSIS — H9193 Unspecified hearing loss, bilateral: Secondary | ICD-10-CM

## 2020-05-15 DIAGNOSIS — R42 Dizziness and giddiness: Secondary | ICD-10-CM

## 2020-05-17 DIAGNOSIS — H2513 Age-related nuclear cataract, bilateral: Secondary | ICD-10-CM | POA: Diagnosis not present

## 2020-05-22 ENCOUNTER — Encounter: Payer: Self-pay | Admitting: Family Medicine

## 2020-05-30 DIAGNOSIS — M47813 Spondylosis without myelopathy or radiculopathy, cervicothoracic region: Secondary | ICD-10-CM | POA: Diagnosis not present

## 2020-05-30 DIAGNOSIS — M9901 Segmental and somatic dysfunction of cervical region: Secondary | ICD-10-CM | POA: Diagnosis not present

## 2020-05-30 DIAGNOSIS — M9903 Segmental and somatic dysfunction of lumbar region: Secondary | ICD-10-CM | POA: Diagnosis not present

## 2020-05-30 DIAGNOSIS — M4724 Other spondylosis with radiculopathy, thoracic region: Secondary | ICD-10-CM | POA: Diagnosis not present

## 2020-05-30 DIAGNOSIS — M9904 Segmental and somatic dysfunction of sacral region: Secondary | ICD-10-CM | POA: Diagnosis not present

## 2020-05-30 DIAGNOSIS — M47814 Spondylosis without myelopathy or radiculopathy, thoracic region: Secondary | ICD-10-CM | POA: Diagnosis not present

## 2020-05-30 DIAGNOSIS — M47816 Spondylosis without myelopathy or radiculopathy, lumbar region: Secondary | ICD-10-CM | POA: Diagnosis not present

## 2020-05-30 DIAGNOSIS — M4723 Other spondylosis with radiculopathy, cervicothoracic region: Secondary | ICD-10-CM | POA: Diagnosis not present

## 2020-05-30 DIAGNOSIS — M9902 Segmental and somatic dysfunction of thoracic region: Secondary | ICD-10-CM | POA: Diagnosis not present

## 2020-05-30 DIAGNOSIS — M5116 Intervertebral disc disorders with radiculopathy, lumbar region: Secondary | ICD-10-CM | POA: Diagnosis not present

## 2020-06-08 ENCOUNTER — Ambulatory Visit
Admission: RE | Admit: 2020-06-08 | Discharge: 2020-06-08 | Disposition: A | Discharge status: Home / Self Care | Payer: Medicare HMO | Source: Ambulatory Visit | Attending: Otolaryngology | Admitting: Otolaryngology

## 2020-06-08 DIAGNOSIS — R42 Dizziness and giddiness: Secondary | ICD-10-CM

## 2020-06-08 DIAGNOSIS — H9193 Unspecified hearing loss, bilateral: Secondary | ICD-10-CM | POA: Diagnosis not present

## 2020-06-08 DIAGNOSIS — H9313 Tinnitus, bilateral: Secondary | ICD-10-CM

## 2020-06-08 MED ORDER — GADOBENATE DIMEGLUMINE 529 MG/ML IV SOLN
10.0000 mL | Freq: Once | INTRAVENOUS | Status: AC | PRN
  Administered 2020-06-08: 10 mL via INTRAVENOUS

## 2020-06-12 ENCOUNTER — Other Ambulatory Visit: Payer: Self-pay

## 2020-06-13 ENCOUNTER — Encounter: Payer: Self-pay | Admitting: Family Medicine

## 2020-06-13 ENCOUNTER — Ambulatory Visit (INDEPENDENT_AMBULATORY_CARE_PROVIDER_SITE_OTHER): Payer: Medicare HMO | Admitting: Family Medicine

## 2020-06-13 VITALS — BP 110/74 | HR 66 | Temp 97.5°F | Resp 16 | Ht 62.25 in | Wt 115.2 lb

## 2020-06-13 DIAGNOSIS — E7800 Pure hypercholesterolemia, unspecified: Secondary | ICD-10-CM | POA: Diagnosis not present

## 2020-06-13 DIAGNOSIS — E2839 Other primary ovarian failure: Secondary | ICD-10-CM

## 2020-06-13 DIAGNOSIS — Z13820 Encounter for screening for osteoporosis: Secondary | ICD-10-CM

## 2020-06-13 DIAGNOSIS — Z1231 Encounter for screening mammogram for malignant neoplasm of breast: Secondary | ICD-10-CM

## 2020-06-13 DIAGNOSIS — R7303 Prediabetes: Secondary | ICD-10-CM

## 2020-06-13 DIAGNOSIS — Z0000 Encounter for general adult medical examination without abnormal findings: Secondary | ICD-10-CM | POA: Diagnosis not present

## 2020-06-13 NOTE — Patient Instructions (Signed)
Health Maintenance, Female Adopting a healthy lifestyle and getting preventive care are important in promoting health and wellness. Ask your health care provider about:  The right schedule for you to have regular tests and exams.  Things you can do on your own to prevent diseases and keep yourself healthy. What should I know about diet, weight, and exercise? Eat a healthy diet  Eat a diet that includes plenty of vegetables, fruits, low-fat dairy products, and lean protein.  Do not eat a lot of foods that are high in solid fats, added sugars, or sodium.   Maintain a healthy weight Body mass index (BMI) is used to identify weight problems. It estimates body fat based on height and weight. Your health care provider can help determine your BMI and help you achieve or maintain a healthy weight. Get regular exercise Get regular exercise. This is one of the most important things you can do for your health. Most adults should:  Exercise for at least 150 minutes each week. The exercise should increase your heart rate and make you sweat (moderate-intensity exercise).  Do strengthening exercises at least twice a week. This is in addition to the moderate-intensity exercise.  Spend less time sitting. Even light physical activity can be beneficial. Watch cholesterol and blood lipids Have your blood tested for lipids and cholesterol at 68 years of age, then have this test every 5 years. Have your cholesterol levels checked more often if:  Your lipid or cholesterol levels are high.  You are older than 68 years of age.  You are at high risk for heart disease. What should I know about cancer screening? Depending on your health history and family history, you may need to have cancer screening at various ages. This may include screening for:  Breast cancer.  Cervical cancer.  Colorectal cancer.  Skin cancer.  Lung cancer. What should I know about heart disease, diabetes, and high blood  pressure? Blood pressure and heart disease  High blood pressure causes heart disease and increases the risk of stroke. This is more likely to develop in people who have high blood pressure readings, are of African descent, or are overweight.  Have your blood pressure checked: ? Every 3-5 years if you are 18-39 years of age. ? Every year if you are 40 years old or older. Diabetes Have regular diabetes screenings. This checks your fasting blood sugar level. Have the screening done:  Once every three years after age 40 if you are at a normal weight and have a low risk for diabetes.  More often and at a younger age if you are overweight or have a high risk for diabetes. What should I know about preventing infection? Hepatitis B If you have a higher risk for hepatitis B, you should be screened for this virus. Talk with your health care provider to find out if you are at risk for hepatitis B infection. Hepatitis C Testing is recommended for:  Everyone born from 1945 through 1965.  Anyone with known risk factors for hepatitis C. Sexually transmitted infections (STIs)  Get screened for STIs, including gonorrhea and chlamydia, if: ? You are sexually active and are younger than 68 years of age. ? You are older than 68 years of age and your health care provider tells you that you are at risk for this type of infection. ? Your sexual activity has changed since you were last screened, and you are at increased risk for chlamydia or gonorrhea. Ask your health care provider   if you are at risk.  Ask your health care provider about whether you are at high risk for HIV. Your health care provider may recommend a prescription medicine to help prevent HIV infection. If you choose to take medicine to prevent HIV, you should first get tested for HIV. You should then be tested every 3 months for as long as you are taking the medicine. Pregnancy  If you are about to stop having your period (premenopausal) and  you may become pregnant, seek counseling before you get pregnant.  Take 400 to 800 micrograms (mcg) of folic acid every day if you become pregnant.  Ask for birth control (contraception) if you want to prevent pregnancy. Osteoporosis and menopause Osteoporosis is a disease in which the bones lose minerals and strength with aging. This can result in bone fractures. If you are 65 years old or older, or if you are at risk for osteoporosis and fractures, ask your health care provider if you should:  Be screened for bone loss.  Take a calcium or vitamin D supplement to lower your risk of fractures.  Be given hormone replacement therapy (HRT) to treat symptoms of menopause. Follow these instructions at home: Lifestyle  Do not use any products that contain nicotine or tobacco, such as cigarettes, e-cigarettes, and chewing tobacco. If you need help quitting, ask your health care provider.  Do not use street drugs.  Do not share needles.  Ask your health care provider for help if you need support or information about quitting drugs. Alcohol use  Do not drink alcohol if: ? Your health care provider tells you not to drink. ? You are pregnant, may be pregnant, or are planning to become pregnant.  If you drink alcohol: ? Limit how much you use to 0-1 drink a day. ? Limit intake if you are breastfeeding.  Be aware of how much alcohol is in your drink. In the [place] one drink equals one 12 oz bottle of beer (355 mL), one 5 oz glass of wine (148 mL), or one 1 oz glass of hard liquor (44 mL). General instructions  Schedule regular health, dental, and eye exams.  Stay current with your vaccines.  Tell your health care provider if: ? You often feel depressed. ? You have ever been abused or do not feel safe at home. Summary  Adopting a healthy lifestyle and getting preventive care are important in promoting health and wellness.  Follow your health care provider's instructions about healthy  diet, exercising, and getting tested or screened for diseases.  Follow your health care provider's instructions on monitoring your cholesterol and blood pressure. This information is not intended to replace advice given to you by your health care provider. Make sure you discuss any questions you have with your health care provider. Document Revised: 03/31/2018 Document Reviewed: 03/31/2018 Elsevier Patient Education  2021 [employer]  

## 2020-06-13 NOTE — Progress Notes (Signed)
Office Note 06/13/2020  CC:  Chief Complaint  Patient presents with  . Annual Exam    HPI:  Katherine Villanueva is a 70 y.o. White female who is here for annual health maintenance exam. She has prediabetes and hyperlipidemia.  She declined trial of statin 1 yr ago.  Feeling well, eats healthy, exercises regularly. No rx meds.  No further vertigo after having a full day of this problem 04/11/20. Went to ENT after that and got eval and pt has no plans for return b/c all fine at this time.  Past Medical History:  Diagnosis Date  . Colon cancer screening 05/2018   Cologuard neg 05/2018; repeat 2023.  [name] GERD (gastroesophageal reflux disease)   . History of kidney stones   . Hypercholesterolemia 02/2017   LDL > 200.  Statin recommended.  Pt chose TLC and LDL decreased to 130 in 2 months.  Rpt 05/2018: LDL 170s, HDL 80->Fram rx = 4.5%->continue TLC. Rec'd statin 05/2019.  [name] Left inguinal hernia 11/2016   Watchful waiting approach  . Osteoporosis 11/04/2018   T score -3.2; recommended fosamax 10/2018.  [name] Prediabetes 05/2019   A1c 6.1%.  . Rheumatic fever   . Vertigo    w/asymmetric hearing loss->ENT to get MRI to r/o retrocochlear lesion as of 05/11/20 eval    Past Surgical History:  Procedure Laterality Date  . COLONOSCOPY     X 2.  Most recent ? 20014?  No abnormals.  [name] DEXA  11/04/2018   T score -3.2 spine  . LAPAROSCOPY  1981   "for possible infertility"  . TONSILLECTOMY  1960    Family History  Problem Relation Age of Onset  . Arthritis Mother   . Stroke Mother   . Kidney disease Mother   . Hyperlipidemia Father   . Heart disease Father   . Diabetes Father   . Stroke Brother   . Galactosemia Son   . Lymphoma Paternal Grandmother   . Diabetes Paternal Grandfather     Social History   Socioeconomic History  . Marital status: Widowed    Spouse name: Not on file  . Number of children: Not on file  . Years of education: Not on file  . Highest education  level: Not on file  Occupational History  . Not on file  Tobacco Use  . Smoking status: Never Smoker  . Smokeless tobacco: Never Used  Vaping Use  . Vaping Use: Never used  Substance and Sexual Activity  . Alcohol use: Yes    Alcohol/week: 3.0 standard drinks    Types: 3 Glasses of wine per week  . Drug use: No  . Sexual activity: Not Currently  Other Topics Concern  . Not on file  Social History Narrative   Married, 1 daughter and one son.   Educ: nursing school.   Occup: retired--hx of LPN work, sign Engineer, technical sales.  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   Moved from [place] 06/2016.   Alc: 4 glasses red wine per week.   No tobacco or drugs.   Social Determinants of Health   Financial Resource Strain: Not on file  Food Insecurity: Not on file  Transportation Needs: Not on file  Physical Activity: Not on file  Stress: Not on file  Social Connections: Not on file  Intimate Partner Violence: Not on file    Outpatient Medications Prior to Visit  Medication Sig Dispense Refill  . ALFALFA PO Take 3 tablets by mouth 2 (two) times daily.    [name]  Ascorbic Acid (VITAMIN C PO) Take 2 tablets by mouth 2 (two) times daily.    [name] BIOTIN PO Take 1 tablet by mouth daily.    . Cyanocobalamin (B-12 PO) Take 1 tablet by mouth daily.    [name] HORSE CHESTNUT PO Take 1 tablet by mouth 2 (two) times daily.    . Levomefolate Glucosamine (METHYLFOLATE PO) Take 1 tablet by mouth daily.    . Multiple Minerals-Vitamins (CALCIUM CITRATE PLUS/MAGNESIUM PO) Take by mouth.    [name] OVER THE COUNTER MEDICATION Take 3 drops by mouth daily. Iodine Detoxadine    . Pyridoxine HCl (B-6 PO) Take 1 tablet by mouth daily.    . TURMERIC CURCUMIN PO Take 2 tablets by mouth daily.    [name] VITAMIN D-VITAMIN K PO Take 1 tablet by mouth daily.    [name] acetaminophen (TYLENOL) 650 MG CR tablet Take 650 mg by mouth every 8 (eight) hours as needed for pain. (Patient not taking: Reported on 06/13/2020)    . meclizine (ANTIVERT) 25 MG tablet  Take 1 tablet (25 mg total) by mouth 3 (three) times daily as needed for dizziness. (Patient not taking: Reported on 06/13/2020) 30 tablet 0  . Calcium-Magnesium-Vitamin D (CALCIUM MAGNESIUM PO) Take 1 tablet by mouth daily. (Patient not taking: Reported on 06/13/2020)    . Iodine Strong, Lugols, (IODINE STRONG PO) Take 1 tablet by mouth daily. (Patient not taking: Reported on 06/13/2020)     No facility-administered medications prior to visit.   No Known Allergies  ROS Review of Systems  Constitutional: Negative for appetite change, chills, fatigue and fever.  HENT: Negative for congestion, dental problem, ear pain and sore throat.   Eyes: Negative for discharge, redness and visual disturbance.  Respiratory: Negative for cough, chest tightness, shortness of breath and wheezing.   Cardiovascular: Negative for chest pain, palpitations and leg swelling.  Gastrointestinal: Negative for abdominal pain, blood in stool, diarrhea, nausea and vomiting.  Genitourinary: Negative for difficulty urinating, dysuria, flank pain, frequency, hematuria and urgency.  Musculoskeletal: Negative for arthralgias, back pain, joint swelling, myalgias and neck stiffness.  Skin: Negative for pallor and rash.  Neurological: Negative for dizziness, speech difficulty, weakness and headaches.  Hematological: Negative for adenopathy. Does not bruise/bleed easily.  Psychiatric/Behavioral: Negative for confusion and sleep disturbance. The patient is not nervous/anxious.     PE; Vitals with BMI 06/13/2020 04/11/2020 04/11/2020  Height 5' 2.25" - -  Weight 115 lbs 3 oz - -  BMI 20.91 - -  Systolic 110 125 707  Diastolic 74 69 68  Pulse 66 69 72   Exam chaperoned by Emi Holes, CMA.  Gen: Alert, well appearing.  Patient is oriented to person, place, time, and situation. AFFECT: pleasant, lucid thought and speech. ENT: Ears: EACs clear, normal epithelium.  TMs with good light reflex and landmarks bilaterally.   Eyes: no injection, icteris, swelling, or exudate.  EOMI, PERRLA. Nose: no drainage or turbinate edema/swelling.  No injection or focal lesion.  Mouth: lips without lesion/swelling.  Oral mucosa pink and moist.  Dentition intact and without obvious caries or gingival swelling.  Oropharynx without erythema, exudate, or swelling.  Neck: supple/nontender.  No LAD, mass, or TM.  Carotid pulses 2+ bilaterally, without bruits. CV: RRR, no m/r/g.   LUNGS: CTA bilat, nonlabored resps, good aeration in all lung fields. ABD: soft, NT, ND, BS normal.  No hepatospenomegaly or mass.  Very flat/thin abdomen, +abd aortic pulsations palpable w/out tenderness or bruits. EXT: no clubbing, cyanosis, or edema.  Musculoskeletal: no joint swelling, erythema, warmth, or tenderness.  ROM of all joints intact. Skin - no sores or suspicious lesions or rashes or color changes   Pertinent labs:  Lab Results  Component Value Date   TSH 3.40 06/13/2019   Lab Results  Component Value Date   WBC 7.0 04/11/2020   HGB 13.4 04/11/2020   HCT 41.1 04/11/2020   MCV 97.6 04/11/2020   PLT 232 04/11/2020   Lab Results  Component Value Date   CREATININE 0.60 04/11/2020   BUN 26 (H) 04/11/2020   NA 141 04/11/2020   K 4.0 04/11/2020   CL 104 04/11/2020   CO2 26 04/11/2020   Lab Results  Component Value Date   ALT 19 04/11/2020   AST 19 04/11/2020   ALKPHOS 52 04/11/2020   BILITOT 1.2 04/11/2020   Lab Results  Component Value Date   CHOL 294 (H) 06/13/2019   Lab Results  Component Value Date   HDL 87.70 06/13/2019   Lab Results  Component Value Date   LDLCALC 191 (H) 06/13/2019   Lab Results  Component Value Date   TRIG 77.0 06/13/2019   Lab Results  Component Value Date   CHOLHDL 3 06/13/2019   Lab Results  Component Value Date   HGBA1C 6.1 06/14/2019   ASSESSMENT AND PLAN:   Health maintenance exam: Reviewed age and gender appropriate health maintenance issues (prudent diet, regular exercise,  health risks of tobacco and excessive alcohol, use of seatbelts, fire alarms in home, use of sunscreen).  Also reviewed age and gender appropriate health screening as well as vaccine recommendations. Vaccines: Tdap, pneumovax, shingrix, flu--> Pt declines. Labs: CMET, FLP, Hba1c today (fasting). Cervical ca screening: no further screening indicated. Breast ca screening: mammogram is due->she declines at this time. Colon ca screening: plan is rpt cologuard 05/2021. Osteoporosis screening: repeat DEXA due 10/2020 but she declines to arrange this for now.  An After Visit Summary was printed and given to the patient.  FOLLOW UP:  Return in about 1 year (around 06/13/2021) for annual CPE (fasting).  Signed:  Santiago Bumpers, MD           06/13/2020

## 2020-06-14 ENCOUNTER — Encounter: Payer: Self-pay | Admitting: Family Medicine

## 2020-06-14 LAB — HEMOGLOBIN A1C
Hgb A1c MFr Bld: 5.8 % of total Hgb — ABNORMAL HIGH (ref <5.7)
Mean Plasma Glucose: 120 mg/dL
eAG (mmol/L): 6.6 mmol/L

## 2020-06-14 LAB — COMPREHENSIVE METABOLIC PANEL
AG Ratio: 1.8 (calc) (ref 1.0–2.5)
ALT: 15 U/L (ref 6–29)
AST: 16 U/L (ref 10–35)
Albumin: 4.4 g/dL (ref 3.6–5.1)
Alkaline phosphatase (APISO): 62 U/L (ref 37–153)
BUN: 18 mg/dL (ref 7–25)
CO2: 25 mmol/L (ref 20–32)
Calcium: 9.3 mg/dL (ref 8.6–10.4)
Chloride: 104 mmol/L (ref 98–110)
Creat: 0.55 mg/dL (ref 0.50–0.99)
Globulin: 2.4 g/dL (calc) (ref 1.9–3.7)
Glucose, Bld: 87 mg/dL (ref 65–99)
Potassium: 4 mmol/L (ref 3.5–5.3)
Sodium: 141 mmol/L (ref 135–146)
Total Bilirubin: 1.3 mg/dL — ABNORMAL HIGH (ref 0.2–1.2)
Total Protein: 6.8 g/dL (ref 6.1–8.1)

## 2020-06-14 LAB — LIPID PANEL
Cholesterol: 257 mg/dL — ABNORMAL HIGH (ref <200)
HDL: 81 mg/dL (ref >=50)
LDL Cholesterol (Calc): 161 mg/dL (calc) — ABNORMAL HIGH
Non-HDL Cholesterol (Calc): 176 mg/dL (calc) — ABNORMAL HIGH (ref <130)
Total CHOL/HDL Ratio: 3.2 (calc) (ref <5.0)
Triglycerides: 49 mg/dL (ref <150)

## 2020-06-26 DIAGNOSIS — M47814 Spondylosis without myelopathy or radiculopathy, thoracic region: Secondary | ICD-10-CM | POA: Diagnosis not present

## 2020-06-26 DIAGNOSIS — M47816 Spondylosis without myelopathy or radiculopathy, lumbar region: Secondary | ICD-10-CM | POA: Diagnosis not present

## 2020-06-26 DIAGNOSIS — M47813 Spondylosis without myelopathy or radiculopathy, cervicothoracic region: Secondary | ICD-10-CM | POA: Diagnosis not present

## 2020-06-26 DIAGNOSIS — M4724 Other spondylosis with radiculopathy, thoracic region: Secondary | ICD-10-CM | POA: Diagnosis not present

## 2020-06-26 DIAGNOSIS — M9901 Segmental and somatic dysfunction of cervical region: Secondary | ICD-10-CM | POA: Diagnosis not present

## 2020-06-26 DIAGNOSIS — M9904 Segmental and somatic dysfunction of sacral region: Secondary | ICD-10-CM | POA: Diagnosis not present

## 2020-06-26 DIAGNOSIS — M4723 Other spondylosis with radiculopathy, cervicothoracic region: Secondary | ICD-10-CM | POA: Diagnosis not present

## 2020-06-26 DIAGNOSIS — M9902 Segmental and somatic dysfunction of thoracic region: Secondary | ICD-10-CM | POA: Diagnosis not present

## 2020-06-26 DIAGNOSIS — M5116 Intervertebral disc disorders with radiculopathy, lumbar region: Secondary | ICD-10-CM | POA: Diagnosis not present

## 2020-06-26 DIAGNOSIS — M9903 Segmental and somatic dysfunction of lumbar region: Secondary | ICD-10-CM | POA: Diagnosis not present

## 2020-07-18 DIAGNOSIS — M9904 Segmental and somatic dysfunction of sacral region: Secondary | ICD-10-CM | POA: Diagnosis not present

## 2020-07-18 DIAGNOSIS — M5116 Intervertebral disc disorders with radiculopathy, lumbar region: Secondary | ICD-10-CM | POA: Diagnosis not present

## 2020-07-18 DIAGNOSIS — M9903 Segmental and somatic dysfunction of lumbar region: Secondary | ICD-10-CM | POA: Diagnosis not present

## 2020-07-18 DIAGNOSIS — M4724 Other spondylosis with radiculopathy, thoracic region: Secondary | ICD-10-CM | POA: Diagnosis not present

## 2020-07-18 DIAGNOSIS — M4723 Other spondylosis with radiculopathy, cervicothoracic region: Secondary | ICD-10-CM | POA: Diagnosis not present

## 2020-07-18 DIAGNOSIS — M9901 Segmental and somatic dysfunction of cervical region: Secondary | ICD-10-CM | POA: Diagnosis not present

## 2020-07-18 DIAGNOSIS — M47813 Spondylosis without myelopathy or radiculopathy, cervicothoracic region: Secondary | ICD-10-CM | POA: Diagnosis not present

## 2020-07-18 DIAGNOSIS — M47816 Spondylosis without myelopathy or radiculopathy, lumbar region: Secondary | ICD-10-CM | POA: Diagnosis not present

## 2020-07-18 DIAGNOSIS — M47814 Spondylosis without myelopathy or radiculopathy, thoracic region: Secondary | ICD-10-CM | POA: Diagnosis not present

## 2020-07-18 DIAGNOSIS — M9902 Segmental and somatic dysfunction of thoracic region: Secondary | ICD-10-CM | POA: Diagnosis not present

## 2020-08-15 DIAGNOSIS — M47813 Spondylosis without myelopathy or radiculopathy, cervicothoracic region: Secondary | ICD-10-CM | POA: Diagnosis not present

## 2020-08-15 DIAGNOSIS — M4724 Other spondylosis with radiculopathy, thoracic region: Secondary | ICD-10-CM | POA: Diagnosis not present

## 2020-08-15 DIAGNOSIS — M9904 Segmental and somatic dysfunction of sacral region: Secondary | ICD-10-CM | POA: Diagnosis not present

## 2020-08-15 DIAGNOSIS — M9902 Segmental and somatic dysfunction of thoracic region: Secondary | ICD-10-CM | POA: Diagnosis not present

## 2020-08-15 DIAGNOSIS — M47816 Spondylosis without myelopathy or radiculopathy, lumbar region: Secondary | ICD-10-CM | POA: Diagnosis not present

## 2020-08-15 DIAGNOSIS — M4723 Other spondylosis with radiculopathy, cervicothoracic region: Secondary | ICD-10-CM | POA: Diagnosis not present

## 2020-08-15 DIAGNOSIS — M5116 Intervertebral disc disorders with radiculopathy, lumbar region: Secondary | ICD-10-CM | POA: Diagnosis not present

## 2020-08-15 DIAGNOSIS — M47814 Spondylosis without myelopathy or radiculopathy, thoracic region: Secondary | ICD-10-CM | POA: Diagnosis not present

## 2020-08-15 DIAGNOSIS — M9901 Segmental and somatic dysfunction of cervical region: Secondary | ICD-10-CM | POA: Diagnosis not present

## 2020-08-15 DIAGNOSIS — M9903 Segmental and somatic dysfunction of lumbar region: Secondary | ICD-10-CM | POA: Diagnosis not present

## 2020-09-13 DIAGNOSIS — M9904 Segmental and somatic dysfunction of sacral region: Secondary | ICD-10-CM | POA: Diagnosis not present

## 2020-09-13 DIAGNOSIS — M5116 Intervertebral disc disorders with radiculopathy, lumbar region: Secondary | ICD-10-CM | POA: Diagnosis not present

## 2020-09-13 DIAGNOSIS — M4723 Other spondylosis with radiculopathy, cervicothoracic region: Secondary | ICD-10-CM | POA: Diagnosis not present

## 2020-09-13 DIAGNOSIS — M4724 Other spondylosis with radiculopathy, thoracic region: Secondary | ICD-10-CM | POA: Diagnosis not present

## 2020-09-13 DIAGNOSIS — M47813 Spondylosis without myelopathy or radiculopathy, cervicothoracic region: Secondary | ICD-10-CM | POA: Diagnosis not present

## 2020-09-13 DIAGNOSIS — M9903 Segmental and somatic dysfunction of lumbar region: Secondary | ICD-10-CM | POA: Diagnosis not present

## 2020-09-13 DIAGNOSIS — M47816 Spondylosis without myelopathy or radiculopathy, lumbar region: Secondary | ICD-10-CM | POA: Diagnosis not present

## 2020-09-13 DIAGNOSIS — M9902 Segmental and somatic dysfunction of thoracic region: Secondary | ICD-10-CM | POA: Diagnosis not present

## 2020-09-13 DIAGNOSIS — M47814 Spondylosis without myelopathy or radiculopathy, thoracic region: Secondary | ICD-10-CM | POA: Diagnosis not present

## 2020-09-13 DIAGNOSIS — M9901 Segmental and somatic dysfunction of cervical region: Secondary | ICD-10-CM | POA: Diagnosis not present

## 2020-10-17 DIAGNOSIS — M9904 Segmental and somatic dysfunction of sacral region: Secondary | ICD-10-CM | POA: Diagnosis not present

## 2020-10-17 DIAGNOSIS — M47814 Spondylosis without myelopathy or radiculopathy, thoracic region: Secondary | ICD-10-CM | POA: Diagnosis not present

## 2020-10-17 DIAGNOSIS — M47813 Spondylosis without myelopathy or radiculopathy, cervicothoracic region: Secondary | ICD-10-CM | POA: Diagnosis not present

## 2020-10-17 DIAGNOSIS — M5116 Intervertebral disc disorders with radiculopathy, lumbar region: Secondary | ICD-10-CM | POA: Diagnosis not present

## 2020-10-17 DIAGNOSIS — M9903 Segmental and somatic dysfunction of lumbar region: Secondary | ICD-10-CM | POA: Diagnosis not present

## 2020-10-17 DIAGNOSIS — M9901 Segmental and somatic dysfunction of cervical region: Secondary | ICD-10-CM | POA: Diagnosis not present

## 2020-10-17 DIAGNOSIS — M9902 Segmental and somatic dysfunction of thoracic region: Secondary | ICD-10-CM | POA: Diagnosis not present

## 2020-10-17 DIAGNOSIS — M4723 Other spondylosis with radiculopathy, cervicothoracic region: Secondary | ICD-10-CM | POA: Diagnosis not present

## 2020-10-17 DIAGNOSIS — M47816 Spondylosis without myelopathy or radiculopathy, lumbar region: Secondary | ICD-10-CM | POA: Diagnosis not present

## 2020-10-17 DIAGNOSIS — M4724 Other spondylosis with radiculopathy, thoracic region: Secondary | ICD-10-CM | POA: Diagnosis not present

## 2020-11-14 DIAGNOSIS — M4724 Other spondylosis with radiculopathy, thoracic region: Secondary | ICD-10-CM | POA: Diagnosis not present

## 2020-11-14 DIAGNOSIS — M47814 Spondylosis without myelopathy or radiculopathy, thoracic region: Secondary | ICD-10-CM | POA: Diagnosis not present

## 2020-11-14 DIAGNOSIS — M9901 Segmental and somatic dysfunction of cervical region: Secondary | ICD-10-CM | POA: Diagnosis not present

## 2020-11-14 DIAGNOSIS — M9904 Segmental and somatic dysfunction of sacral region: Secondary | ICD-10-CM | POA: Diagnosis not present

## 2020-11-14 DIAGNOSIS — M47816 Spondylosis without myelopathy or radiculopathy, lumbar region: Secondary | ICD-10-CM | POA: Diagnosis not present

## 2020-11-14 DIAGNOSIS — M4723 Other spondylosis with radiculopathy, cervicothoracic region: Secondary | ICD-10-CM | POA: Diagnosis not present

## 2020-11-14 DIAGNOSIS — M47813 Spondylosis without myelopathy or radiculopathy, cervicothoracic region: Secondary | ICD-10-CM | POA: Diagnosis not present

## 2020-11-14 DIAGNOSIS — M5116 Intervertebral disc disorders with radiculopathy, lumbar region: Secondary | ICD-10-CM | POA: Diagnosis not present

## 2020-11-14 DIAGNOSIS — M9903 Segmental and somatic dysfunction of lumbar region: Secondary | ICD-10-CM | POA: Diagnosis not present

## 2020-11-14 DIAGNOSIS — M9902 Segmental and somatic dysfunction of thoracic region: Secondary | ICD-10-CM | POA: Diagnosis not present

## 2020-12-11 ENCOUNTER — Telehealth: Payer: Self-pay | Admitting: Family Medicine

## 2020-12-11 NOTE — Telephone Encounter (Signed)
Copied from CRM [id number]. Topic: Medicare AWV >> Dec 11, 2020  2:02 PM Claudette Laws R wrote: Reason for CRM:  left message  [facility] schedule change - AWV rsch to 9.1.22 @ 1:15 by phone

## 2020-12-12 DIAGNOSIS — R030 Elevated blood-pressure reading, without diagnosis of hypertension: Secondary | ICD-10-CM | POA: Diagnosis not present

## 2020-12-18 DIAGNOSIS — M47814 Spondylosis without myelopathy or radiculopathy, thoracic region: Secondary | ICD-10-CM | POA: Diagnosis not present

## 2020-12-18 DIAGNOSIS — M4724 Other spondylosis with radiculopathy, thoracic region: Secondary | ICD-10-CM | POA: Diagnosis not present

## 2020-12-18 DIAGNOSIS — M47813 Spondylosis without myelopathy or radiculopathy, cervicothoracic region: Secondary | ICD-10-CM | POA: Diagnosis not present

## 2020-12-18 DIAGNOSIS — M9902 Segmental and somatic dysfunction of thoracic region: Secondary | ICD-10-CM | POA: Diagnosis not present

## 2020-12-18 DIAGNOSIS — M5116 Intervertebral disc disorders with radiculopathy, lumbar region: Secondary | ICD-10-CM | POA: Diagnosis not present

## 2020-12-18 DIAGNOSIS — M9903 Segmental and somatic dysfunction of lumbar region: Secondary | ICD-10-CM | POA: Diagnosis not present

## 2020-12-18 DIAGNOSIS — M4723 Other spondylosis with radiculopathy, cervicothoracic region: Secondary | ICD-10-CM | POA: Diagnosis not present

## 2020-12-18 DIAGNOSIS — M9901 Segmental and somatic dysfunction of cervical region: Secondary | ICD-10-CM | POA: Diagnosis not present

## 2020-12-18 DIAGNOSIS — M47816 Spondylosis without myelopathy or radiculopathy, lumbar region: Secondary | ICD-10-CM | POA: Diagnosis not present

## 2020-12-18 DIAGNOSIS — M9904 Segmental and somatic dysfunction of sacral region: Secondary | ICD-10-CM | POA: Diagnosis not present

## 2020-12-19 ENCOUNTER — Ambulatory Visit: Payer: Medicare HMO

## 2020-12-20 ENCOUNTER — Ambulatory Visit (INDEPENDENT_AMBULATORY_CARE_PROVIDER_SITE_OTHER): Payer: Medicare HMO | Admitting: *Deleted

## 2020-12-20 DIAGNOSIS — Z1231 Encounter for screening mammogram for malignant neoplasm of breast: Secondary | ICD-10-CM

## 2020-12-20 DIAGNOSIS — Z0000 Encounter for general adult medical examination without abnormal findings: Secondary | ICD-10-CM

## 2020-12-20 DIAGNOSIS — Z780 Asymptomatic menopausal state: Secondary | ICD-10-CM

## 2020-12-20 NOTE — Patient Instructions (Signed)
[name] , Thank you for taking time to come for your Medicare Wellness Visit. I appreciate your ongoing commitment to your health goals. Please review the following plan we discussed and let me know if I can assist you in the future.   Screening recommendations/referrals: Colonoscopy: Education provided Mammogram: Education provided Bone Density: Education provided Recommended yearly ophthalmology/optometry visit for glaucoma screening and checkup Recommended yearly dental visit for hygiene and checkup  Vaccinations: Influenza vaccine: Education provided Pneumococcal vaccine: Education provided Tdap vaccine: Education provided Shingles vaccine: Education provided    Advanced directives: copy requested  Conditions/risks identified:   Next appointment: 06-13-2021 @ 8:00 am  Dr. Milinda Cave   Preventive Care 65 Years and Older, Female Preventive care refers to lifestyle choices and visits with your health care provider that can promote health and wellness. What does preventive care include? A yearly physical exam. This is also called an annual well check. Dental exams once or twice a year. Routine eye exams. Ask your health care provider how often you should have your eyes checked. Personal lifestyle choices, including: Daily care of your teeth and gums. Regular physical activity. Eating a healthy diet. Avoiding tobacco and drug use. Limiting alcohol use. Practicing safe sex. Taking low-dose aspirin every day. Taking vitamin and mineral supplements as recommended by your health care provider. What happens during an annual well check? The services and screenings done by your health care provider during your annual well check will depend on your age, overall health, lifestyle risk factors, and family history of disease. Counseling  Your health care provider may ask you questions about your: Alcohol use. Tobacco use. Drug use. Emotional well-being. Home and relationship  well-being. Sexual activity. Eating habits. History of falls. Memory and ability to understand (cognition). Work and work Astronomer. Reproductive health. Screening  You may have the following tests or measurements: Height, weight, and BMI. Blood pressure. Lipid and cholesterol levels. These may be checked every 5 years, or more frequently if you are over 92 years old. Skin check. Lung cancer screening. You may have this screening every year starting at age 75 if you have a 30-pack-year history of smoking and currently smoke or have quit within the past 15 years. Fecal occult blood test (FOBT) of the stool. You may have this test every year starting at age 53. Flexible sigmoidoscopy or colonoscopy. You may have a sigmoidoscopy every 5 years or a colonoscopy every 10 years starting at age 56. Hepatitis C blood test. Hepatitis B blood test. Sexually transmitted disease (STD) testing. Diabetes screening. This is done by checking your blood sugar (glucose) after you have not eaten for a while (fasting). You may have this done every 1-3 years. Bone density scan. This is done to screen for osteoporosis. You may have this done starting at age 87. Mammogram. This may be done every 1-2 years. Talk to your health care provider about how often you should have regular mammograms. Talk with your health care provider about your test results, treatment options, and if necessary, the need for more tests. Vaccines  Your health care provider may recommend certain vaccines, such as: Influenza vaccine. This is recommended every year. Tetanus, diphtheria, and acellular pertussis (Tdap, Td) vaccine. You may need a Td booster every 10 years. Zoster vaccine. You may need this after age 67. Pneumococcal 13-valent conjugate (PCV13) vaccine. One dose is recommended after age 56. Pneumococcal polysaccharide (PPSV23) vaccine. One dose is recommended after age 54. Talk to your health care provider about which  screenings and vaccines you need and how often you need them. This information is not intended to replace advice given to you by your health care provider. Make sure you discuss any questions you have with your health care provider. Document Released: 05/04/2015 Document Revised: 12/26/2015 Document Reviewed: 02/06/2015 Elsevier Interactive Patient Education  2017 [place] Prevention in the Home Falls can cause injuries. They can happen to people of all ages. There are many things you can do to make your home safe and to help prevent falls. What can I do on the outside of my home? Regularly fix the edges of walkways and driveways and fix any cracks. Remove anything that might make you trip as you walk through a door, such as a raised step or threshold. Trim any bushes or trees on the path to your home. Use bright outdoor lighting. Clear any walking paths of anything that might make someone trip, such as rocks or tools. Regularly check to see if handrails are loose or broken. Make sure that both sides of any steps have handrails. Any raised decks and porches should have guardrails on the edges. Have any leaves, snow, or ice cleared regularly. Use sand or salt on walking paths during winter. Clean up any spills in your garage right away. This includes oil or grease spills. What can I do in the bathroom? Use night lights. Install grab bars by the toilet and in the tub and shower. Do not use towel bars as grab bars. Use non-skid mats or decals in the tub or shower. If you need to sit down in the shower, use a plastic, non-slip stool. Keep the floor dry. Clean up any water that spills on the floor as soon as it happens. Remove soap buildup in the tub or shower regularly. Attach bath mats securely with double-sided non-slip rug tape. Do not have throw rugs and other things on the floor that can make you trip. What can I do in the bedroom? Use night lights. Make sure that you have a  light by your bed that is easy to reach. Do not use any sheets or blankets that are too big for your bed. They should not hang down onto the floor. Have a firm chair that has side arms. You can use this for support while you get dressed. Do not have throw rugs and other things on the floor that can make you trip. What can I do in the kitchen? Clean up any spills right away. Avoid walking on wet floors. Keep items that you use a lot in easy-to-reach places. If you need to reach something above you, use a strong step stool that has a grab bar. Keep electrical cords out of the way. Do not use floor polish or wax that makes floors slippery. If you must use wax, use non-skid floor wax. Do not have throw rugs and other things on the floor that can make you trip. What can I do with my stairs? Do not leave any items on the stairs. Make sure that there are handrails on both sides of the stairs and use them. Fix handrails that are broken or loose. Make sure that handrails are as long as the stairways. Check any carpeting to make sure that it is firmly attached to the stairs. Fix any carpet that is loose or worn. Avoid having throw rugs at the top or bottom of the stairs. If you do have throw rugs, attach them to the floor with carpet tape.  Make sure that you have a light switch at the top of the stairs and the bottom of the stairs. If you do not have them, ask someone to add them for you. What else can I do to help prevent falls? Wear shoes that: Do not have high heels. Have rubber bottoms. Are comfortable and fit you well. Are closed at the toe. Do not wear sandals. If you use a stepladder: Make sure that it is fully opened. Do not climb a closed stepladder. Make sure that both sides of the stepladder are locked into place. Ask someone to hold it for you, if possible. Clearly mark and make sure that you can see: Any grab bars or handrails. First and last steps. Where the edge of each step  is. Use tools that help you move around (mobility aids) if they are needed. These include: Canes. Walkers. Scooters. Crutches. Turn on the lights when you go into a dark area. Replace any light bulbs as soon as they burn out. Set up your furniture so you have a clear path. Avoid moving your furniture around. If any of your floors are uneven, fix them. If there are any pets around you, be aware of where they are. Review your medicines with your doctor. Some medicines can make you feel dizzy. This can increase your chance of falling. Ask your doctor what other things that you can do to help prevent falls. This information is not intended to replace advice given to you by your health care provider. Make sure you discuss any questions you have with your health care provider. Document Released: 02/01/2009 Document Revised: 09/13/2015 Document Reviewed: 05/12/2014 Elsevier Interactive Patient Education  2017 [employer].

## 2020-12-20 NOTE — Progress Notes (Signed)
Subjective:   Katherine Villanueva is a 70 y.o. female who presents for Medicare Annual (Subsequent) preventive examination.  I connected with  Holli Humbles on 12/20/20 by a telephone enabled telemedicine application and verified that I am speaking with the correct person using two identifiers.   I discussed the limitations of evaluation and management by telemedicine. The patient expressed understanding and agreed to proceed.   Review of Systems     Cardiac Risk Factors include: advanced age (>18men, >52 women)     Objective:    Today's Vitals   There is no height or weight on file to calculate BMI.  Advanced Directives 12/20/2020 04/11/2020  Does Patient Have a Medical Advance Directive? Yes No  Type of Estate agent of [place];Living will -  Copy of Healthcare Power of Attorney in Chart? No - copy requested -    Current Medications (verified) Outpatient Encounter Medications as of 12/20/2020  Medication Sig   acetaminophen (TYLENOL) 650 MG CR tablet Take 650 mg by mouth every 8 (eight) hours as needed for pain.   ALFALFA PO Take 3 tablets by mouth 2 (two) times daily.   Ascorbic Acid (VITAMIN C PO) Take 2 tablets by mouth 2 (two) times daily.   BIOTIN PO Take 1 tablet by mouth daily.   Cyanocobalamin (B-12 PO) Take 1 tablet by mouth daily.   HORSE CHESTNUT PO Take 1 tablet by mouth 2 (two) times daily.   Levomefolate Glucosamine (METHYLFOLATE PO) Take 1 tablet by mouth daily.   meclizine (ANTIVERT) 25 MG tablet Take 1 tablet (25 mg total) by mouth 3 (three) times daily as needed for dizziness.   Multiple Minerals-Vitamins (CALCIUM CITRATE PLUS/MAGNESIUM PO) Take by mouth.   OVER THE COUNTER MEDICATION Take 3 drops by mouth daily. Iodine Detoxadine   Pyridoxine HCl (B-6 PO) Take 1 tablet by mouth daily.   TURMERIC CURCUMIN PO Take 2 tablets by mouth daily.   VITAMIN D-VITAMIN K PO Take 1 tablet by mouth daily.   No facility-administered encounter  medications on file as of 12/20/2020.    Allergies (verified) Patient has no known allergies.   History: Past Medical History:  Diagnosis Date   Colon cancer screening 05/2018   Cologuard neg 05/2018; repeat 2023.   GERD (gastroesophageal reflux disease)    History of kidney stones    Hypercholesterolemia 02/2017   LDL > 200. Pt chose TLC and LDL decreased to 130 in 2 months.  Rpt 05/2018: LDL 170s, HDL 80->Fram rx = 4.5%->continue TLC. Stable (10 yr frmhm cv risk 6.5%) 05/2020 on TLC.   Left inguinal hernia 11/2016   Watchful waiting approach   Osteoporosis 11/04/2018   T score -3.2; recommended fosamax 10/2018.   Prediabetes 05/2019   2021 A1c 6.1%.  Feb 2022 a1c 5.8%.   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   Past Surgical History:  Procedure Laterality Date   COLONOSCOPY     X 2.  Most recent ? 20014?  No abnormals.   DEXA  11/04/2018   T score -3.2 spine   LAPAROSCOPY  1981   "for possible infertility"   TONSILLECTOMY  1960   Family History  Problem Relation Age of Onset   Arthritis Mother  Stroke Mother    Kidney disease Mother    Hyperlipidemia Father    Heart disease Father    Diabetes Father    Stroke Brother    Galactosemia Son    Lymphoma Paternal Grandmother    Diabetes Paternal Grandfather    Social History   Socioeconomic History   Marital status: Widowed    Spouse name: Not on file   Number of children: Not on file   Years of education: Not on file   Highest education level: Not on file  Occupational History   Not on file  Tobacco Use   Smoking status: Never   Smokeless tobacco: Never  Vaping Use   Vaping Use: Never used  Substance and Sexual Activity   Alcohol use: Yes    Alcohol/week: 3.0 standard drinks    Types: 3  Glasses of wine per week   Drug use: No   Sexual activity: Not Currently  Other Topics Concern   Not on file  Social History Narrative   Married, 1 daughter and one son.   Educ: nursing school.   Occup: retired--hx of LPN work, sign Engineer, technical sales.  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   Moved from [place] 06/2016.   Alc: 4 glasses red wine per week.   No tobacco or drugs.   Social Determinants of Health   Financial Resource Strain: Low Risk    Difficulty of Paying Living Expenses: Not hard at all  Food Insecurity: No Food Insecurity   Worried About Programme researcher, broadcasting/film/video in the Last Year: Never true   Ran Out of Food in the Last Year: Never true  Transportation Needs: No Transportation Needs   Lack of Transportation (Medical): No   Lack of Transportation (Non-Medical): No  Physical Activity: Sufficiently Active   Days of Exercise per Week: 5 days   Minutes of Exercise per Session: 30 min  Stress: No Stress Concern Present   Feeling of Stress : Not at all  Social Connections: Moderately Integrated   Frequency of Communication with Friends and Family: More than three times a week   Frequency of Social Gatherings with Friends and Family: More than three times a week   Attends Religious Services: More than 4 times per year   Active Member of [employer] or Organizations: Yes   Attends Banker Meetings: More than 4 times per year   Marital Status: Widowed    Tobacco Counseling Counseling given: Not Answered   Clinical Intake:  Pre-visit preparation completed: Yes  Pain : No/denies pain     Nutritional Risks: None Diabetes: No  How often do you need to have someone help you when you read instructions, pamphlets, or other written materials from your doctor or pharmacy?: 1 - Never  Diabetic? no  Interpreter Needed?: No  Information entered by :: Remi Haggard LPN   Activities of Daily Living In your present state of health, do you have any difficulty performing the  following activities: 12/20/2020  Hearing? Y  Vision? N  Difficulty concentrating or making decisions? N  Walking or climbing stairs? N  Dressing or bathing? N  Doing errands, shopping? N  Preparing Food and eating ? N  Using the Toilet? N  In the past six months, have you accidently leaked urine? N  Do you have problems with loss of bowel control? N  Managing your Medications? N  Managing your Finances? N  Housekeeping or managing your Housekeeping? N  Some recent data might be hidden  Patient Care Team: Jeoffrey MassedMcGowen, Philip H, MD as PCP - General (Family Medicine) Serena Colonelosen, Jefry, MD as Consulting Physician (Otolaryngology)  Indicate any recent Medical Services you may have received from other than [facility] providers in the past year (date may be approximate).     Assessment:   This is a routine wellness examination for [place].  Hearing/Vision screen Hearing Screening - Comments:: Yes trouble hearing  Hearing check in February Not in need of hearing aids  Vision Screening - Comments:: [facility] to date [facility]  Dietary issues and exercise activities discussed: Current Exercise Habits: Home exercise routine, Time (Minutes): 30, Frequency (Times/Week): 4, Weekly Exercise (Minutes/Week): 120, Intensity: Moderate   Goals Addressed             This Visit's Progress    Patient Stated       Increase exercise        Depression Screen PHQ 2/9 Scores 12/20/2020 06/13/2020 06/13/2019 06/07/2018 12/16/2016  PHQ - 2 Score 0 0 0 0 0    Fall Risk Fall Risk  12/20/2020 06/13/2020 06/13/2019 06/07/2018  Falls in the past year? 0 0 0 0  Number falls in past yr: 0 0 0 -  Injury with Fall? 0 0 0 -  Follow up Falls evaluation completed;Falls prevention discussed Falls evaluation completed Falls evaluation completed -    FALL RISK PREVENTION PERTAINING TO THE HOME:  Any stairs in or around the home? Yes  If so, are there any without handrails? No  Home free of loose throw rugs in  walkways, pet beds, electrical cords, etc? Yes  Adequate lighting in your home to reduce risk of falls? Yes   ASSISTIVE DEVICES UTILIZED TO PREVENT FALLS:  Life alert? No  Use of a cane, walker or w/c? No  Grab bars in the bathroom? Yes  Shower chair or bench in shower? No  Elevated toilet seat or a handicapped toilet? No   TIMED UP AND GO:  Was the test performed? No .    Cognitive Function:  Normal cognitive status assessed by direct observation by this Nurse Health Advisor. No abnormalities found.          Immunizations  There is no immunization history on file for this patient.  TDAP status: Due, Education has been provided regarding the importance of this vaccine. Advised may receive this vaccine at local pharmacy or Health Dept. Aware to provide a copy of the vaccination record if obtained from local pharmacy or Health Dept. Verbalized acceptance and understanding.  Flu Vaccine status: Declined, Education has been provided regarding the importance of this vaccine but patient still declined. Advised may receive this vaccine at local pharmacy or Health Dept. Aware to provide a copy of the vaccination record if obtained from local pharmacy or Health Dept. Verbalized acceptance and understanding.  Pneumococcal vaccine status: Declined,  Education has been provided regarding the importance of this vaccine but patient still declined. Advised may receive this vaccine at local pharmacy or Health Dept. Aware to provide a copy of the vaccination record if obtained from local pharmacy or Health Dept. Verbalized acceptance and understanding.   Covid-19 vaccine status: Declined, Education has been provided regarding the importance of this vaccine but patient still declined. Advised may receive this vaccine at local pharmacy or Health Dept.or vaccine clinic. Aware to provide a copy of the vaccination record if obtained from local pharmacy or Health Dept. Verbalized acceptance and  understanding.  Qualifies for Shingles Vaccine? Yes   Zostavax completed No  Shingrix Completed?: No.    Education has been provided regarding the importance of this vaccine. Patient has been advised to call insurance company to determine out of pocket expense if they have not yet received this vaccine. Advised may also receive vaccine at local pharmacy or Health Dept. Verbalized acceptance and understanding.  Screening Tests Health Maintenance  Topic Date Due   MAMMOGRAM  11/03/2020   COVID-19 Vaccine (1) 01/05/2021 (Originally 05/18/1955)   Zoster Vaccines- Shingrix (1 of 2) 03/21/2021 (Originally 05/18/2002)   TETANUS/TDAP  06/13/2021 (Originally 05/19/1969)   PNA vac Low Risk Adult (1 of 2 - PCV13) 06/13/2021 (Originally 05/18/2017)   INFLUENZA VACCINE  07/19/2021 (Originally 11/19/2020)   Fecal DNA (Cologuard)  06/15/2021   DEXA SCAN  Completed   HPV VACCINES  Aged Out   Hepatitis C Screening  Discontinued    Health Maintenance  Health Maintenance Due  Topic Date Due   MAMMOGRAM  11/03/2020    Colonoscopy Declined  Mammogram status: Ordered  . Pt provided with contact info and advised to call to schedule appt.   Bone Density status: Completed  . Results reflect: Bone density results: OSTEOPOROSIS. Repeat every 2 years.  Lung Cancer Screening: (Low Dose CT Chest recommended if Age 65-80 years, 30 pack-year currently smoking OR have quit w/in 15years.) does not qualify.   Lung Cancer Screening Referral:   Additional Screening:  Hepatitis C Screening: does qualify;   Vision Screening: Recommended annual ophthalmology exams for early detection of glaucoma and other disorders of the eye. Is the patient up to date with their annual eye exam?  Yes  Who is the provider or what is the name of the office in which the patient attends annual eye exams? [facility] If pt is not established with a provider, would they like to be referred to a provider to establish care? No .    Dental Screening: Recommended annual dental exams for proper oral hygiene  Community Resource Referral / Chronic Care Management: CRR required this visit?  No   CCM required this visit?  No      Plan:     I have personally reviewed and noted the following in the patient's chart:   Medical and social history Use of alcohol, tobacco or illicit drugs  Current medications and supplements including opioid prescriptions.  Functional ability and status Nutritional status Physical activity Advanced directives List of other physicians Hospitalizations, surgeries, and ER visits in previous 12 months Vitals Screenings to include cognitive, depression, and falls Referrals and appointments  In addition, I have reviewed and discussed with patient certain preventive protocols, quality metrics, and best practice recommendations. A written personalized care plan for preventive services as well as general preventive health recommendations were provided to patient.     Remi Haggard, LPN   0/09/3014   Nurse Notes:

## 2021-01-22 DIAGNOSIS — M5116 Intervertebral disc disorders with radiculopathy, lumbar region: Secondary | ICD-10-CM | POA: Diagnosis not present

## 2021-01-22 DIAGNOSIS — M9903 Segmental and somatic dysfunction of lumbar region: Secondary | ICD-10-CM | POA: Diagnosis not present

## 2021-01-22 DIAGNOSIS — M4724 Other spondylosis with radiculopathy, thoracic region: Secondary | ICD-10-CM | POA: Diagnosis not present

## 2021-01-22 DIAGNOSIS — M47814 Spondylosis without myelopathy or radiculopathy, thoracic region: Secondary | ICD-10-CM | POA: Diagnosis not present

## 2021-01-22 DIAGNOSIS — M9901 Segmental and somatic dysfunction of cervical region: Secondary | ICD-10-CM | POA: Diagnosis not present

## 2021-01-22 DIAGNOSIS — M47816 Spondylosis without myelopathy or radiculopathy, lumbar region: Secondary | ICD-10-CM | POA: Diagnosis not present

## 2021-01-22 DIAGNOSIS — M9904 Segmental and somatic dysfunction of sacral region: Secondary | ICD-10-CM | POA: Diagnosis not present

## 2021-01-22 DIAGNOSIS — M9902 Segmental and somatic dysfunction of thoracic region: Secondary | ICD-10-CM | POA: Diagnosis not present

## 2021-01-22 DIAGNOSIS — M47813 Spondylosis without myelopathy or radiculopathy, cervicothoracic region: Secondary | ICD-10-CM | POA: Diagnosis not present

## 2021-01-22 DIAGNOSIS — M4723 Other spondylosis with radiculopathy, cervicothoracic region: Secondary | ICD-10-CM | POA: Diagnosis not present

## 2021-02-20 DIAGNOSIS — M4723 Other spondylosis with radiculopathy, cervicothoracic region: Secondary | ICD-10-CM | POA: Diagnosis not present

## 2021-02-20 DIAGNOSIS — M9903 Segmental and somatic dysfunction of lumbar region: Secondary | ICD-10-CM | POA: Diagnosis not present

## 2021-02-20 DIAGNOSIS — M9902 Segmental and somatic dysfunction of thoracic region: Secondary | ICD-10-CM | POA: Diagnosis not present

## 2021-02-20 DIAGNOSIS — M47813 Spondylosis without myelopathy or radiculopathy, cervicothoracic region: Secondary | ICD-10-CM | POA: Diagnosis not present

## 2021-02-20 DIAGNOSIS — M5116 Intervertebral disc disorders with radiculopathy, lumbar region: Secondary | ICD-10-CM | POA: Diagnosis not present

## 2021-02-20 DIAGNOSIS — M47814 Spondylosis without myelopathy or radiculopathy, thoracic region: Secondary | ICD-10-CM | POA: Diagnosis not present

## 2021-02-20 DIAGNOSIS — M4724 Other spondylosis with radiculopathy, thoracic region: Secondary | ICD-10-CM | POA: Diagnosis not present

## 2021-02-20 DIAGNOSIS — M9901 Segmental and somatic dysfunction of cervical region: Secondary | ICD-10-CM | POA: Diagnosis not present

## 2021-02-20 DIAGNOSIS — M9904 Segmental and somatic dysfunction of sacral region: Secondary | ICD-10-CM | POA: Diagnosis not present

## 2021-02-20 DIAGNOSIS — M47816 Spondylosis without myelopathy or radiculopathy, lumbar region: Secondary | ICD-10-CM | POA: Diagnosis not present

## 2021-03-05 DIAGNOSIS — M47816 Spondylosis without myelopathy or radiculopathy, lumbar region: Secondary | ICD-10-CM | POA: Diagnosis not present

## 2021-03-05 DIAGNOSIS — M5116 Intervertebral disc disorders with radiculopathy, lumbar region: Secondary | ICD-10-CM | POA: Diagnosis not present

## 2021-03-05 DIAGNOSIS — M47814 Spondylosis without myelopathy or radiculopathy, thoracic region: Secondary | ICD-10-CM | POA: Diagnosis not present

## 2021-03-05 DIAGNOSIS — M9903 Segmental and somatic dysfunction of lumbar region: Secondary | ICD-10-CM | POA: Diagnosis not present

## 2021-03-05 DIAGNOSIS — M9904 Segmental and somatic dysfunction of sacral region: Secondary | ICD-10-CM | POA: Diagnosis not present

## 2021-03-05 DIAGNOSIS — M4724 Other spondylosis with radiculopathy, thoracic region: Secondary | ICD-10-CM | POA: Diagnosis not present

## 2021-03-05 DIAGNOSIS — M9901 Segmental and somatic dysfunction of cervical region: Secondary | ICD-10-CM | POA: Diagnosis not present

## 2021-03-05 DIAGNOSIS — M9902 Segmental and somatic dysfunction of thoracic region: Secondary | ICD-10-CM | POA: Diagnosis not present

## 2021-03-05 DIAGNOSIS — M47813 Spondylosis without myelopathy or radiculopathy, cervicothoracic region: Secondary | ICD-10-CM | POA: Diagnosis not present

## 2021-03-05 DIAGNOSIS — M4723 Other spondylosis with radiculopathy, cervicothoracic region: Secondary | ICD-10-CM | POA: Diagnosis not present

## 2021-03-19 DIAGNOSIS — M9904 Segmental and somatic dysfunction of sacral region: Secondary | ICD-10-CM | POA: Diagnosis not present

## 2021-03-19 DIAGNOSIS — M9903 Segmental and somatic dysfunction of lumbar region: Secondary | ICD-10-CM | POA: Diagnosis not present

## 2021-03-19 DIAGNOSIS — M47816 Spondylosis without myelopathy or radiculopathy, lumbar region: Secondary | ICD-10-CM | POA: Diagnosis not present

## 2021-03-19 DIAGNOSIS — M5116 Intervertebral disc disorders with radiculopathy, lumbar region: Secondary | ICD-10-CM | POA: Diagnosis not present

## 2021-03-19 DIAGNOSIS — M9901 Segmental and somatic dysfunction of cervical region: Secondary | ICD-10-CM | POA: Diagnosis not present

## 2021-03-19 DIAGNOSIS — M47814 Spondylosis without myelopathy or radiculopathy, thoracic region: Secondary | ICD-10-CM | POA: Diagnosis not present

## 2021-03-19 DIAGNOSIS — M4724 Other spondylosis with radiculopathy, thoracic region: Secondary | ICD-10-CM | POA: Diagnosis not present

## 2021-03-19 DIAGNOSIS — M9902 Segmental and somatic dysfunction of thoracic region: Secondary | ICD-10-CM | POA: Diagnosis not present

## 2021-03-19 DIAGNOSIS — M47813 Spondylosis without myelopathy or radiculopathy, cervicothoracic region: Secondary | ICD-10-CM | POA: Diagnosis not present

## 2021-03-19 DIAGNOSIS — M4723 Other spondylosis with radiculopathy, cervicothoracic region: Secondary | ICD-10-CM | POA: Diagnosis not present

## 2021-04-24 DIAGNOSIS — M9901 Segmental and somatic dysfunction of cervical region: Secondary | ICD-10-CM | POA: Diagnosis not present

## 2021-04-24 DIAGNOSIS — M9902 Segmental and somatic dysfunction of thoracic region: Secondary | ICD-10-CM | POA: Diagnosis not present

## 2021-04-24 DIAGNOSIS — M47814 Spondylosis without myelopathy or radiculopathy, thoracic region: Secondary | ICD-10-CM | POA: Diagnosis not present

## 2021-04-24 DIAGNOSIS — M4724 Other spondylosis with radiculopathy, thoracic region: Secondary | ICD-10-CM | POA: Diagnosis not present

## 2021-04-24 DIAGNOSIS — M47813 Spondylosis without myelopathy or radiculopathy, cervicothoracic region: Secondary | ICD-10-CM | POA: Diagnosis not present

## 2021-04-24 DIAGNOSIS — M4723 Other spondylosis with radiculopathy, cervicothoracic region: Secondary | ICD-10-CM | POA: Diagnosis not present

## 2021-04-24 DIAGNOSIS — M47816 Spondylosis without myelopathy or radiculopathy, lumbar region: Secondary | ICD-10-CM | POA: Diagnosis not present

## 2021-04-24 DIAGNOSIS — M9904 Segmental and somatic dysfunction of sacral region: Secondary | ICD-10-CM | POA: Diagnosis not present

## 2021-04-24 DIAGNOSIS — M9903 Segmental and somatic dysfunction of lumbar region: Secondary | ICD-10-CM | POA: Diagnosis not present

## 2021-04-24 DIAGNOSIS — M5116 Intervertebral disc disorders with radiculopathy, lumbar region: Secondary | ICD-10-CM | POA: Diagnosis not present

## 2021-05-22 DIAGNOSIS — M5116 Intervertebral disc disorders with radiculopathy, lumbar region: Secondary | ICD-10-CM | POA: Diagnosis not present

## 2021-05-22 DIAGNOSIS — M9902 Segmental and somatic dysfunction of thoracic region: Secondary | ICD-10-CM | POA: Diagnosis not present

## 2021-05-22 DIAGNOSIS — M9904 Segmental and somatic dysfunction of sacral region: Secondary | ICD-10-CM | POA: Diagnosis not present

## 2021-05-22 DIAGNOSIS — M47816 Spondylosis without myelopathy or radiculopathy, lumbar region: Secondary | ICD-10-CM | POA: Diagnosis not present

## 2021-05-22 DIAGNOSIS — M4723 Other spondylosis with radiculopathy, cervicothoracic region: Secondary | ICD-10-CM | POA: Diagnosis not present

## 2021-05-22 DIAGNOSIS — M47813 Spondylosis without myelopathy or radiculopathy, cervicothoracic region: Secondary | ICD-10-CM | POA: Diagnosis not present

## 2021-05-22 DIAGNOSIS — M4724 Other spondylosis with radiculopathy, thoracic region: Secondary | ICD-10-CM | POA: Diagnosis not present

## 2021-05-22 DIAGNOSIS — M9901 Segmental and somatic dysfunction of cervical region: Secondary | ICD-10-CM | POA: Diagnosis not present

## 2021-05-22 DIAGNOSIS — M9903 Segmental and somatic dysfunction of lumbar region: Secondary | ICD-10-CM | POA: Diagnosis not present

## 2021-05-22 DIAGNOSIS — M47814 Spondylosis without myelopathy or radiculopathy, thoracic region: Secondary | ICD-10-CM | POA: Diagnosis not present

## 2021-06-12 ENCOUNTER — Other Ambulatory Visit: Payer: Self-pay

## 2021-06-13 ENCOUNTER — Ambulatory Visit (INDEPENDENT_AMBULATORY_CARE_PROVIDER_SITE_OTHER): Payer: Medicare HMO | Admitting: Family Medicine

## 2021-06-13 ENCOUNTER — Encounter: Payer: Self-pay | Admitting: Family Medicine

## 2021-06-13 VITALS — BP 90/57 | HR 73 | Temp 98.4°F | Ht 64.96 in | Wt 115.8 lb

## 2021-06-13 DIAGNOSIS — Z1231 Encounter for screening mammogram for malignant neoplasm of breast: Secondary | ICD-10-CM | POA: Diagnosis not present

## 2021-06-13 DIAGNOSIS — E7800 Pure hypercholesterolemia, unspecified: Secondary | ICD-10-CM

## 2021-06-13 DIAGNOSIS — Z0000 Encounter for general adult medical examination without abnormal findings: Secondary | ICD-10-CM | POA: Diagnosis not present

## 2021-06-13 DIAGNOSIS — E348 Other specified endocrine disorders: Secondary | ICD-10-CM

## 2021-06-13 DIAGNOSIS — Z1211 Encounter for screening for malignant neoplasm of colon: Secondary | ICD-10-CM | POA: Diagnosis not present

## 2021-06-13 DIAGNOSIS — R7303 Prediabetes: Secondary | ICD-10-CM | POA: Diagnosis not present

## 2021-06-13 LAB — CBC WITH DIFFERENTIAL/PLATELET
Basophils Absolute: 0 10*3/uL (ref 0.0–0.1)
Basophils Relative: 0.8 % (ref 0.0–3.0)
Eosinophils Absolute: 0.1 10*3/uL (ref 0.0–0.7)
Eosinophils Relative: 1 % (ref 0.0–5.0)
HCT: 40.7 % (ref 36.0–46.0)
Hemoglobin: 13.7 g/dL (ref 12.0–15.0)
Lymphocytes Relative: 29.2 % (ref 12.0–46.0)
Lymphs Abs: 1.7 10*3/uL (ref 0.7–4.0)
MCHC: 33.7 g/dL (ref 30.0–36.0)
MCV: 95.4 fl (ref 78.0–100.0)
Monocytes Absolute: 0.5 10*3/uL (ref 0.1–1.0)
Monocytes Relative: 9.2 % (ref 3.0–12.0)
Neutro Abs: 3.5 10*3/uL (ref 1.4–7.7)
Neutrophils Relative %: 59.8 % (ref 43.0–77.0)
Platelets: 225 10*3/uL (ref 150.0–400.0)
RBC: 4.27 Mil/uL (ref 3.87–5.11)
RDW: 13.2 % (ref 11.5–15.5)
WBC: 5.8 10*3/uL (ref 4.0–10.5)

## 2021-06-13 LAB — LIPID PANEL
Cholesterol: 260 mg/dL — ABNORMAL HIGH (ref 0–200)
HDL: 81.2 mg/dL (ref >39.00)
LDL Cholesterol: 167 mg/dL — ABNORMAL HIGH (ref 0–99)
NonHDL: 178.39
Total CHOL/HDL Ratio: 3
Triglycerides: 58 mg/dL (ref 0.0–149.0)
VLDL: 11.6 mg/dL (ref 0.0–40.0)

## 2021-06-13 LAB — COMPREHENSIVE METABOLIC PANEL
ALT: 15 U/L (ref 0–35)
AST: 17 U/L (ref 0–37)
Albumin: 4.4 g/dL (ref 3.5–5.2)
Alkaline Phosphatase: 53 U/L (ref 39–117)
BUN: 19 mg/dL (ref 6–23)
CO2: 31 mEq/L (ref 19–32)
Calcium: 9.3 mg/dL (ref 8.4–10.5)
Chloride: 103 mEq/L (ref 96–112)
Creatinine, Ser: 0.74 mg/dL (ref 0.40–1.20)
GFR: 82.75 mL/min (ref >60.00)
Glucose, Bld: 86 mg/dL (ref 70–99)
Potassium: 4.1 mEq/L (ref 3.5–5.1)
Sodium: 140 mEq/L (ref 135–145)
Total Bilirubin: 1.1 mg/dL (ref 0.2–1.2)
Total Protein: 6.6 g/dL (ref 6.0–8.3)

## 2021-06-13 LAB — HEMOGLOBIN A1C: Hgb A1c MFr Bld: 6.1 % (ref 4.6–6.5)

## 2021-06-13 LAB — TSH: TSH: 4.83 u[IU]/mL (ref 0.35–5.50)

## 2021-06-13 NOTE — Patient Instructions (Signed)

## 2021-06-13 NOTE — Progress Notes (Signed)
Office Note 06/13/2021  CC:  Chief Complaint  Patient presents with   Annual Exam    Pt is fasting    HPI:  Patient is a 71 y.o. female who is here for annual health maintenance exam and f/u HLD and prediabetes.  She feels well.  Has 3 grandchildren now, all girls.  She watches the newest 1 during the day so her mom can work. She is very active. Diet is healthy.  Past Medical History:  Diagnosis Date   Colon cancer screening 05/2018   Cologuard neg 05/2018; repeat 2023.   GERD (gastroesophageal reflux disease)    History of kidney stones    Hypercholesterolemia 02/2017   LDL > 200. Pt chose TLC and LDL decreased to 130 in 2 months.  Rpt 05/2018: LDL 170s, HDL 80->Fram rx = 4.5%->continue TLC. Stable (10 yr frmhm cv risk 6.5%) 05/2020 on TLC.   Left inguinal hernia 11/2016   Watchful waiting approach   Osteoporosis 11/04/2018   T score -3.2; recommended fosamax 10/2018.   Prediabetes 05/2019   2021 A1c 6.1%.  Feb 2022 a1c 5.8%.   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    Past Surgical History:  Procedure Laterality Date   COLONOSCOPY     X 2.  Most recent ? 20014?  No abnormals.   DEXA  11/04/2018   T score -3.2 spine   LAPAROSCOPY  1981   "for possible infertility"   TONSILLECTOMY  1960    Family History  Problem Relation Age of Onset   Arthritis Mother    Stroke Mother    Kidney disease Mother    Hyperlipidemia Father    Heart disease Father    Diabetes Father    Stroke Brother    Galactosemia Son    Lymphoma Paternal Grandmother    Diabetes Paternal Grandfather     Social History   Socioeconomic History   Marital status: Widowed    Spouse name: Not on file   Number of children: Not on file   Years of education: Not on file    Highest education level: Not on file  Occupational History   Not on file  Tobacco Use   Smoking status: Never   Smokeless tobacco: Never  Vaping Use   Vaping Use: Never used  Substance and Sexual Activity   Alcohol use: Yes    Alcohol/week: 3.0 standard drinks    Types: 3 Glasses of wine per week   Drug use: No   Sexual activity: Not Currently  Other Topics Concern   Not on file  Social History Narrative   Married, 1 daughter and one son.   Educ: nursing school.   Occup: retired--hx of LPN work, sign Sport and exercise psychologist.  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   Moved from [place] 06/2016.   Alc: 4 glasses red wine per week.   No tobacco or drugs.   Social Determinants of Health   Financial Resource Strain: Low Risk    Difficulty of Paying Living Expenses: Not hard at all  Food Insecurity: No Food Insecurity   Worried About Charity fundraiser in the Last Year: Never true   Ran Out of Food in the Last Year: Never true  Transportation Needs:  No Transportation Needs   Lack of Transportation (Medical): No   Lack of Transportation (Non-Medical): No  Physical Activity: Sufficiently Active   Days of Exercise per Week: 5 days   Minutes of Exercise per Session: 30 min  Stress: No Stress Concern Present   Feeling of Stress : Not at all  Social Connections: Moderately Integrated   Frequency of Communication with Friends and Family: More than three times a week   Frequency of Social Gatherings with Friends and Family: More than three times a week   Attends Religious Services: More than 4 times per year   Active Member of [employer] or Organizations: Yes   Attends Archivist Meetings: More than 4 times per year   Marital Status: Widowed  Human resources officer Violence: Not At Risk   Fear of Current or Ex-Partner: No   Emotionally Abused: No   Physically Abused: No   Sexually Abused: No    Outpatient Medications Prior to Visit  Medication Sig Dispense Refill   acetaminophen (TYLENOL) 650  MG CR tablet Take 650 mg by mouth every 8 (eight) hours as needed for pain.     ALFALFA PO Take 3 tablets by mouth 2 (two) times daily.     Ascorbic Acid (VITAMIN C PO) Take 2 tablets by mouth 2 (two) times daily.     BIOTIN PO Take 1 tablet by mouth daily.     Cyanocobalamin (B-12 PO) Take 1 tablet by mouth daily.     HORSE CHESTNUT PO Take 1 tablet by mouth 2 (two) times daily.     Levomefolate Glucosamine (METHYLFOLATE PO) Take 1 tablet by mouth daily.     meclizine (ANTIVERT) 25 MG tablet Take 1 tablet (25 mg total) by mouth 3 (three) times daily as needed for dizziness. 30 tablet 0   Multiple Minerals-Vitamins (CALCIUM CITRATE PLUS/MAGNESIUM PO) Take by mouth.     OVER THE COUNTER MEDICATION Take 3 drops by mouth daily. Iodine Detoxadine     Pyridoxine HCl (B-6 PO) Take 1 tablet by mouth daily.     TURMERIC CURCUMIN PO Take 2 tablets by mouth daily.     VITAMIN D-VITAMIN K PO Take 1 tablet by mouth daily.     No facility-administered medications prior to visit.    No Known Allergies  ROS Review of Systems  Constitutional:  Negative for appetite change, chills, fatigue and fever.  HENT:  Negative for congestion, dental problem, ear pain and sore throat.   Eyes:  Negative for discharge, redness and visual disturbance.  Respiratory:  Negative for cough, chest tightness, shortness of breath and wheezing.   Cardiovascular:  Negative for chest pain, palpitations and leg swelling.  Gastrointestinal:  Negative for abdominal pain, blood in stool, diarrhea, nausea and vomiting.  Genitourinary:  Negative for difficulty urinating, dysuria, flank pain, frequency, hematuria and urgency.  Musculoskeletal:  Negative for arthralgias, back pain, joint swelling, myalgias and neck stiffness.  Skin:  Negative for pallor and rash.  Neurological:  Negative for dizziness, speech difficulty, weakness and headaches.  Hematological:  Negative for adenopathy. Does not bruise/bleed easily.   Psychiatric/Behavioral:  Negative for confusion and sleep disturbance. The patient is not nervous/anxious.    PE; Vitals with BMI 06/13/2021 12/20/2020 06/13/2020  Height 5' 4.961" (No Data) 5' 2.25"  Weight 115 lbs 13 oz (No Data) 115 lbs 3 oz  BMI [id number] - 0000000  Systolic 90 (No Data) [id number]  Diastolic 57 (No Data) 74  Pulse 73 - 66  Exam chaperoned  by Shepard General, CMA  Gen: Alert, well appearing.  Patient is oriented to person, place, time, and situation. AFFECT: pleasant, lucid thought and speech. ENT: Ears: EACs clear, normal epithelium.  TMs with good light reflex and landmarks bilaterally.  Eyes: no injection, icteris, swelling, or exudate.  EOMI, PERRLA. Nose: no drainage or turbinate edema/swelling.  No injection or focal lesion.  Mouth: lips without lesion/swelling.  Oral mucosa pink and moist.  Dentition intact and without obvious caries or gingival swelling.  Oropharynx without erythema, exudate, or swelling.  Neck: supple/nontender.  No LAD, mass, or TM.  Carotid pulses 2+ bilaterally, without bruits. CV: RRR, no m/r/g.   LUNGS: CTA bilat, nonlabored resps, good aeration in all lung fields. ABD: soft, NT, ND, BS normal.  No hepatospenomegaly or mass.  No bruits. EXT: no clubbing, cyanosis, or edema.  Musculoskeletal: no joint swelling, erythema, warmth, or tenderness.  ROM of all joints intact. Skin - Many small nevi, benign-appearing.  No sores or suspicious lesions or rashes or color changes  Pertinent labs:  Lab Results  Component Value Date   TSH 3.40 06/13/2019   Lab Results  Component Value Date   WBC 7.0 04/11/2020   HGB 13.4 04/11/2020   HCT 41.1 04/11/2020   MCV 97.6 04/11/2020   PLT 232 04/11/2020   Lab Results  Component Value Date   CREATININE 0.55 06/13/2020   BUN 18 06/13/2020   NA 141 06/13/2020   K 4.0 06/13/2020   CL 104 06/13/2020   CO2 25 06/13/2020   Lab Results  Component Value Date   ALT 15 06/13/2020   AST 16 06/13/2020   ALKPHOS 52  04/11/2020   BILITOT 1.3 (H) 06/13/2020   Lab Results  Component Value Date   CHOL 257 (H) 06/13/2020   Lab Results  Component Value Date   HDL 81 06/13/2020   Lab Results  Component Value Date   LDLCALC 161 (H) 06/13/2020   Lab Results  Component Value Date   TRIG 49 06/13/2020   Lab Results  Component Value Date   CHOLHDL 3.2 06/13/2020   Lab Results  Component Value Date   HGBA1C 5.8 (H) 06/13/2020   ASSESSMENT AND PLAN:   Health maintenance exam: Reviewed age and gender appropriate health maintenance issues (prudent diet, regular exercise, health risks of tobacco and excessive alcohol, use of seatbelts, fire alarms in home, use of sunscreen).  Also reviewed age and gender appropriate health screening as well as vaccine recommendations. Vaccines: Tdap, pneumovax, shingrix, flu--> Pt declines. Labs: cbc, tsh, CMET, FLP, Hba1c today (fasting). Cervical ca screening: no further screening indicated. Breast ca screening: mammogram is due->ordered. Colon ca screening: repeat cologuard due->ordered Osteoporosis: she wants to check DEXA but states she won't take any rx for osteoporosis-->she had a friend who took med for this and got a femur fracture. Cont ca++ and vit D and good wt bearing exercise habits.  An After Visit Summary was printed and given to the patient.  FOLLOW UP:  No follow-ups on file.  Signed:  Crissie Sickles, MD           06/13/2021

## 2021-06-19 DIAGNOSIS — M9903 Segmental and somatic dysfunction of lumbar region: Secondary | ICD-10-CM | POA: Diagnosis not present

## 2021-06-19 DIAGNOSIS — M47814 Spondylosis without myelopathy or radiculopathy, thoracic region: Secondary | ICD-10-CM | POA: Diagnosis not present

## 2021-06-19 DIAGNOSIS — M9901 Segmental and somatic dysfunction of cervical region: Secondary | ICD-10-CM | POA: Diagnosis not present

## 2021-06-19 DIAGNOSIS — M9902 Segmental and somatic dysfunction of thoracic region: Secondary | ICD-10-CM | POA: Diagnosis not present

## 2021-06-19 DIAGNOSIS — M4724 Other spondylosis with radiculopathy, thoracic region: Secondary | ICD-10-CM | POA: Diagnosis not present

## 2021-06-19 DIAGNOSIS — M47816 Spondylosis without myelopathy or radiculopathy, lumbar region: Secondary | ICD-10-CM | POA: Diagnosis not present

## 2021-06-19 DIAGNOSIS — M4723 Other spondylosis with radiculopathy, cervicothoracic region: Secondary | ICD-10-CM | POA: Diagnosis not present

## 2021-06-19 DIAGNOSIS — M9904 Segmental and somatic dysfunction of sacral region: Secondary | ICD-10-CM | POA: Diagnosis not present

## 2021-06-19 DIAGNOSIS — M47813 Spondylosis without myelopathy or radiculopathy, cervicothoracic region: Secondary | ICD-10-CM | POA: Diagnosis not present

## 2021-06-19 DIAGNOSIS — M5116 Intervertebral disc disorders with radiculopathy, lumbar region: Secondary | ICD-10-CM | POA: Diagnosis not present

## 2021-06-20 DIAGNOSIS — H2513 Age-related nuclear cataract, bilateral: Secondary | ICD-10-CM | POA: Diagnosis not present

## 2021-07-01 DIAGNOSIS — Z1211 Encounter for screening for malignant neoplasm of colon: Secondary | ICD-10-CM | POA: Diagnosis not present

## 2021-07-09 LAB — COLOGUARD: COLOGUARD: NEGATIVE

## 2021-07-17 DIAGNOSIS — M47814 Spondylosis without myelopathy or radiculopathy, thoracic region: Secondary | ICD-10-CM | POA: Diagnosis not present

## 2021-07-17 DIAGNOSIS — M5116 Intervertebral disc disorders with radiculopathy, lumbar region: Secondary | ICD-10-CM | POA: Diagnosis not present

## 2021-07-17 DIAGNOSIS — M4724 Other spondylosis with radiculopathy, thoracic region: Secondary | ICD-10-CM | POA: Diagnosis not present

## 2021-07-17 DIAGNOSIS — M9904 Segmental and somatic dysfunction of sacral region: Secondary | ICD-10-CM | POA: Diagnosis not present

## 2021-07-17 DIAGNOSIS — M9901 Segmental and somatic dysfunction of cervical region: Secondary | ICD-10-CM | POA: Diagnosis not present

## 2021-07-17 DIAGNOSIS — M47816 Spondylosis without myelopathy or radiculopathy, lumbar region: Secondary | ICD-10-CM | POA: Diagnosis not present

## 2021-07-17 DIAGNOSIS — M47813 Spondylosis without myelopathy or radiculopathy, cervicothoracic region: Secondary | ICD-10-CM | POA: Diagnosis not present

## 2021-07-17 DIAGNOSIS — M4723 Other spondylosis with radiculopathy, cervicothoracic region: Secondary | ICD-10-CM | POA: Diagnosis not present

## 2021-07-17 DIAGNOSIS — M9903 Segmental and somatic dysfunction of lumbar region: Secondary | ICD-10-CM | POA: Diagnosis not present

## 2021-07-17 DIAGNOSIS — M9902 Segmental and somatic dysfunction of thoracic region: Secondary | ICD-10-CM | POA: Diagnosis not present

## 2021-08-13 DIAGNOSIS — M4723 Other spondylosis with radiculopathy, cervicothoracic region: Secondary | ICD-10-CM | POA: Diagnosis not present

## 2021-08-13 DIAGNOSIS — S336XXA Sprain of sacroiliac joint, initial encounter: Secondary | ICD-10-CM | POA: Diagnosis not present

## 2021-08-13 DIAGNOSIS — M47813 Spondylosis without myelopathy or radiculopathy, cervicothoracic region: Secondary | ICD-10-CM | POA: Diagnosis not present

## 2021-08-13 DIAGNOSIS — M9901 Segmental and somatic dysfunction of cervical region: Secondary | ICD-10-CM | POA: Diagnosis not present

## 2021-08-13 DIAGNOSIS — M9903 Segmental and somatic dysfunction of lumbar region: Secondary | ICD-10-CM | POA: Diagnosis not present

## 2021-08-13 DIAGNOSIS — M47814 Spondylosis without myelopathy or radiculopathy, thoracic region: Secondary | ICD-10-CM | POA: Diagnosis not present

## 2021-08-13 DIAGNOSIS — M4724 Other spondylosis with radiculopathy, thoracic region: Secondary | ICD-10-CM | POA: Diagnosis not present

## 2021-08-13 DIAGNOSIS — M9904 Segmental and somatic dysfunction of sacral region: Secondary | ICD-10-CM | POA: Diagnosis not present

## 2021-08-13 DIAGNOSIS — M5116 Intervertebral disc disorders with radiculopathy, lumbar region: Secondary | ICD-10-CM | POA: Diagnosis not present

## 2021-08-13 DIAGNOSIS — M9902 Segmental and somatic dysfunction of thoracic region: Secondary | ICD-10-CM | POA: Diagnosis not present

## 2021-08-13 DIAGNOSIS — M47816 Spondylosis without myelopathy or radiculopathy, lumbar region: Secondary | ICD-10-CM | POA: Diagnosis not present

## 2021-09-11 DIAGNOSIS — M47814 Spondylosis without myelopathy or radiculopathy, thoracic region: Secondary | ICD-10-CM | POA: Diagnosis not present

## 2021-09-11 DIAGNOSIS — M9903 Segmental and somatic dysfunction of lumbar region: Secondary | ICD-10-CM | POA: Diagnosis not present

## 2021-09-11 DIAGNOSIS — M47813 Spondylosis without myelopathy or radiculopathy, cervicothoracic region: Secondary | ICD-10-CM | POA: Diagnosis not present

## 2021-09-11 DIAGNOSIS — M4723 Other spondylosis with radiculopathy, cervicothoracic region: Secondary | ICD-10-CM | POA: Diagnosis not present

## 2021-09-11 DIAGNOSIS — S336XXA Sprain of sacroiliac joint, initial encounter: Secondary | ICD-10-CM | POA: Diagnosis not present

## 2021-09-11 DIAGNOSIS — M47816 Spondylosis without myelopathy or radiculopathy, lumbar region: Secondary | ICD-10-CM | POA: Diagnosis not present

## 2021-09-11 DIAGNOSIS — M9904 Segmental and somatic dysfunction of sacral region: Secondary | ICD-10-CM | POA: Diagnosis not present

## 2021-09-11 DIAGNOSIS — M9901 Segmental and somatic dysfunction of cervical region: Secondary | ICD-10-CM | POA: Diagnosis not present

## 2021-09-11 DIAGNOSIS — M4724 Other spondylosis with radiculopathy, thoracic region: Secondary | ICD-10-CM | POA: Diagnosis not present

## 2021-09-11 DIAGNOSIS — M5116 Intervertebral disc disorders with radiculopathy, lumbar region: Secondary | ICD-10-CM | POA: Diagnosis not present

## 2021-09-11 DIAGNOSIS — M9902 Segmental and somatic dysfunction of thoracic region: Secondary | ICD-10-CM | POA: Diagnosis not present

## 2021-10-02 ENCOUNTER — Ambulatory Visit (INDEPENDENT_AMBULATORY_CARE_PROVIDER_SITE_OTHER): Payer: Medicare HMO

## 2021-10-02 DIAGNOSIS — Z780 Asymptomatic menopausal state: Secondary | ICD-10-CM

## 2021-10-02 DIAGNOSIS — Z1231 Encounter for screening mammogram for malignant neoplasm of breast: Secondary | ICD-10-CM | POA: Diagnosis not present

## 2021-10-02 DIAGNOSIS — M810 Age-related osteoporosis without current pathological fracture: Secondary | ICD-10-CM | POA: Diagnosis not present

## 2021-10-02 DIAGNOSIS — E348 Other specified endocrine disorders: Secondary | ICD-10-CM

## 2021-10-04 ENCOUNTER — Encounter: Payer: Self-pay | Admitting: Family Medicine

## 2021-10-08 DIAGNOSIS — M4724 Other spondylosis with radiculopathy, thoracic region: Secondary | ICD-10-CM | POA: Diagnosis not present

## 2021-10-08 DIAGNOSIS — M47816 Spondylosis without myelopathy or radiculopathy, lumbar region: Secondary | ICD-10-CM | POA: Diagnosis not present

## 2021-10-08 DIAGNOSIS — M47813 Spondylosis without myelopathy or radiculopathy, cervicothoracic region: Secondary | ICD-10-CM | POA: Diagnosis not present

## 2021-10-08 DIAGNOSIS — M9902 Segmental and somatic dysfunction of thoracic region: Secondary | ICD-10-CM | POA: Diagnosis not present

## 2021-10-08 DIAGNOSIS — M5116 Intervertebral disc disorders with radiculopathy, lumbar region: Secondary | ICD-10-CM | POA: Diagnosis not present

## 2021-10-08 DIAGNOSIS — S336XXA Sprain of sacroiliac joint, initial encounter: Secondary | ICD-10-CM | POA: Diagnosis not present

## 2021-10-08 DIAGNOSIS — M4723 Other spondylosis with radiculopathy, cervicothoracic region: Secondary | ICD-10-CM | POA: Diagnosis not present

## 2021-10-08 DIAGNOSIS — M47814 Spondylosis without myelopathy or radiculopathy, thoracic region: Secondary | ICD-10-CM | POA: Diagnosis not present

## 2021-10-08 DIAGNOSIS — M9901 Segmental and somatic dysfunction of cervical region: Secondary | ICD-10-CM | POA: Diagnosis not present

## 2021-10-08 DIAGNOSIS — M9904 Segmental and somatic dysfunction of sacral region: Secondary | ICD-10-CM | POA: Diagnosis not present

## 2021-10-08 DIAGNOSIS — M9903 Segmental and somatic dysfunction of lumbar region: Secondary | ICD-10-CM | POA: Diagnosis not present

## 2021-10-28 DIAGNOSIS — M47816 Spondylosis without myelopathy or radiculopathy, lumbar region: Secondary | ICD-10-CM | POA: Diagnosis not present

## 2021-10-28 DIAGNOSIS — M4723 Other spondylosis with radiculopathy, cervicothoracic region: Secondary | ICD-10-CM | POA: Diagnosis not present

## 2021-10-28 DIAGNOSIS — M4724 Other spondylosis with radiculopathy, thoracic region: Secondary | ICD-10-CM | POA: Diagnosis not present

## 2021-10-28 DIAGNOSIS — M47813 Spondylosis without myelopathy or radiculopathy, cervicothoracic region: Secondary | ICD-10-CM | POA: Diagnosis not present

## 2021-10-28 DIAGNOSIS — M9903 Segmental and somatic dysfunction of lumbar region: Secondary | ICD-10-CM | POA: Diagnosis not present

## 2021-10-28 DIAGNOSIS — M9901 Segmental and somatic dysfunction of cervical region: Secondary | ICD-10-CM | POA: Diagnosis not present

## 2021-10-28 DIAGNOSIS — M47814 Spondylosis without myelopathy or radiculopathy, thoracic region: Secondary | ICD-10-CM | POA: Diagnosis not present

## 2021-10-28 DIAGNOSIS — M9904 Segmental and somatic dysfunction of sacral region: Secondary | ICD-10-CM | POA: Diagnosis not present

## 2021-10-28 DIAGNOSIS — M5116 Intervertebral disc disorders with radiculopathy, lumbar region: Secondary | ICD-10-CM | POA: Diagnosis not present

## 2021-10-28 DIAGNOSIS — M9902 Segmental and somatic dysfunction of thoracic region: Secondary | ICD-10-CM | POA: Diagnosis not present

## 2021-10-28 DIAGNOSIS — S336XXA Sprain of sacroiliac joint, initial encounter: Secondary | ICD-10-CM | POA: Diagnosis not present

## 2021-11-18 DIAGNOSIS — S336XXA Sprain of sacroiliac joint, initial encounter: Secondary | ICD-10-CM | POA: Diagnosis not present

## 2021-11-18 DIAGNOSIS — M9902 Segmental and somatic dysfunction of thoracic region: Secondary | ICD-10-CM | POA: Diagnosis not present

## 2021-11-18 DIAGNOSIS — M4723 Other spondylosis with radiculopathy, cervicothoracic region: Secondary | ICD-10-CM | POA: Diagnosis not present

## 2021-11-18 DIAGNOSIS — M9904 Segmental and somatic dysfunction of sacral region: Secondary | ICD-10-CM | POA: Diagnosis not present

## 2021-11-18 DIAGNOSIS — M9903 Segmental and somatic dysfunction of lumbar region: Secondary | ICD-10-CM | POA: Diagnosis not present

## 2021-11-18 DIAGNOSIS — M47813 Spondylosis without myelopathy or radiculopathy, cervicothoracic region: Secondary | ICD-10-CM | POA: Diagnosis not present

## 2021-11-18 DIAGNOSIS — M5116 Intervertebral disc disorders with radiculopathy, lumbar region: Secondary | ICD-10-CM | POA: Diagnosis not present

## 2021-11-18 DIAGNOSIS — M47816 Spondylosis without myelopathy or radiculopathy, lumbar region: Secondary | ICD-10-CM | POA: Diagnosis not present

## 2021-11-18 DIAGNOSIS — M47814 Spondylosis without myelopathy or radiculopathy, thoracic region: Secondary | ICD-10-CM | POA: Diagnosis not present

## 2021-11-18 DIAGNOSIS — M4724 Other spondylosis with radiculopathy, thoracic region: Secondary | ICD-10-CM | POA: Diagnosis not present

## 2021-11-18 DIAGNOSIS — M9901 Segmental and somatic dysfunction of cervical region: Secondary | ICD-10-CM | POA: Diagnosis not present

## 2021-12-16 DIAGNOSIS — S336XXA Sprain of sacroiliac joint, initial encounter: Secondary | ICD-10-CM | POA: Diagnosis not present

## 2021-12-16 DIAGNOSIS — M5116 Intervertebral disc disorders with radiculopathy, lumbar region: Secondary | ICD-10-CM | POA: Diagnosis not present

## 2021-12-16 DIAGNOSIS — M4724 Other spondylosis with radiculopathy, thoracic region: Secondary | ICD-10-CM | POA: Diagnosis not present

## 2021-12-16 DIAGNOSIS — M47816 Spondylosis without myelopathy or radiculopathy, lumbar region: Secondary | ICD-10-CM | POA: Diagnosis not present

## 2021-12-16 DIAGNOSIS — M4723 Other spondylosis with radiculopathy, cervicothoracic region: Secondary | ICD-10-CM | POA: Diagnosis not present

## 2021-12-16 DIAGNOSIS — M9902 Segmental and somatic dysfunction of thoracic region: Secondary | ICD-10-CM | POA: Diagnosis not present

## 2021-12-16 DIAGNOSIS — M47813 Spondylosis without myelopathy or radiculopathy, cervicothoracic region: Secondary | ICD-10-CM | POA: Diagnosis not present

## 2021-12-16 DIAGNOSIS — M9901 Segmental and somatic dysfunction of cervical region: Secondary | ICD-10-CM | POA: Diagnosis not present

## 2021-12-16 DIAGNOSIS — M47814 Spondylosis without myelopathy or radiculopathy, thoracic region: Secondary | ICD-10-CM | POA: Diagnosis not present

## 2021-12-16 DIAGNOSIS — M9903 Segmental and somatic dysfunction of lumbar region: Secondary | ICD-10-CM | POA: Diagnosis not present

## 2021-12-16 DIAGNOSIS — M9904 Segmental and somatic dysfunction of sacral region: Secondary | ICD-10-CM | POA: Diagnosis not present

## 2021-12-17 DIAGNOSIS — Z823 Family history of stroke: Secondary | ICD-10-CM | POA: Diagnosis not present

## 2021-12-17 DIAGNOSIS — R030 Elevated blood-pressure reading, without diagnosis of hypertension: Secondary | ICD-10-CM | POA: Diagnosis not present

## 2021-12-17 DIAGNOSIS — Z8249 Family history of ischemic heart disease and other diseases of the circulatory system: Secondary | ICD-10-CM | POA: Diagnosis not present

## 2021-12-17 DIAGNOSIS — Z833 Family history of diabetes mellitus: Secondary | ICD-10-CM | POA: Diagnosis not present

## 2022-01-01 ENCOUNTER — Ambulatory Visit: Payer: Medicare HMO

## 2022-01-08 ENCOUNTER — Ambulatory Visit (INDEPENDENT_AMBULATORY_CARE_PROVIDER_SITE_OTHER): Payer: Medicare HMO

## 2022-01-08 DIAGNOSIS — Z0000 Encounter for general adult medical examination without abnormal findings: Secondary | ICD-10-CM | POA: Diagnosis not present

## 2022-01-08 NOTE — Patient Instructions (Signed)
Katherine Villanueva , Thank you for taking time to come for your Medicare Wellness Visit. I appreciate your ongoing commitment to your health goals. Please review the following plan we discussed and let me know if I can assist you in the future.   Screening recommendations/referrals: Colonoscopy: cologuard 07/01/21 Mammogram: done 10/02/21 repeat every year  Bone Density: done 10/02/21 Recommended yearly ophthalmology/optometry visit for glaucoma screening and checkup Recommended yearly dental visit for hygiene and checkup  Vaccinations: Influenza vaccine: declined  Pneumococcal vaccine: declined Tdap vaccine: declined  Shingles vaccine: Shingrix is 2 doses 2-6 months apart and over 90% effective    Covid-19:declined and discussed   Advanced directives: Please bring a copy of your health care power of attorney and living will to the office at your convenience.  Conditions/risks identified: wants to increase weight   Next appointment: Follow up in one year for your annual wellness visit    Preventive Care 65 Years and Older, Female Preventive care refers to lifestyle choices and visits with your health care provider that can promote health and wellness. What does preventive care include? A yearly physical exam. This is also called an annual well check. Dental exams once or twice a year. Routine eye exams. Ask your health care provider how often you should have your eyes checked. Personal lifestyle choices, including: Daily care of your teeth and gums. Regular physical activity. Eating a healthy diet. Avoiding tobacco and drug use. Limiting alcohol use. Practicing safe sex. Taking low-dose aspirin every day. Taking vitamin and mineral supplements as recommended by your health care provider. What happens during an annual well check? The services and screenings done by your health care provider during your annual well check will depend on your age, overall health, lifestyle risk factors, and  family history of disease. Counseling  Your health care provider may ask you questions about your: Alcohol use. Tobacco use. Drug use. Emotional well-being. Home and relationship well-being. Sexual activity. Eating habits. History of falls. Memory and ability to understand (cognition). Work and work Statistician. Reproductive health. Screening  You may have the following tests or measurements: Height, weight, and BMI. Blood pressure. Lipid and cholesterol levels. These may be checked every 5 years, or more frequently if you are over 33 years old. Skin check. Lung cancer screening. You may have this screening every year starting at age 59 if you have a 30-pack-year history of smoking and currently smoke or have quit within the past 15 years. Fecal occult blood test (FOBT) of the stool. You may have this test every year starting at age 89. Flexible sigmoidoscopy or colonoscopy. You may have a sigmoidoscopy every 5 years or a colonoscopy every 10 years starting at age 78. Hepatitis C blood test. Hepatitis B blood test. Sexually transmitted disease (STD) testing. Diabetes screening. This is done by checking your blood sugar (glucose) after you have not eaten for a while (fasting). You may have this done every 1-3 years. Bone density scan. This is done to screen for osteoporosis. You may have this done starting at age 2. Mammogram. This may be done every 1-2 years. Talk to your health care provider about how often you should have regular mammograms. Talk with your health care provider about your test results, treatment options, and if necessary, the need for more tests. Vaccines  Your health care provider may recommend certain vaccines, such as: Influenza vaccine. This is recommended every year. Tetanus, diphtheria, and acellular pertussis (Tdap, Td) vaccine. You may need a Td booster every  10 years. Zoster vaccine. You may need this after age 56. Pneumococcal 13-valent conjugate  (PCV13) vaccine. One dose is recommended after age 73. Pneumococcal polysaccharide (PPSV23) vaccine. One dose is recommended after age 38. Talk to your health care provider about which screenings and vaccines you need and how often you need them. This information is not intended to replace advice given to you by your health care provider. Make sure you discuss any questions you have with your health care provider. Document Released: 05/04/2015 Document Revised: 12/26/2015 Document Reviewed: 02/06/2015 Elsevier Interactive Patient Education  2017 [place] Prevention in the Home Falls can cause injuries. They can happen to people of all ages. There are many things you can do to make your home safe and to help prevent falls. What can I do on the outside of my home? Regularly fix the edges of walkways and driveways and fix any cracks. Remove anything that might make you trip as you walk through a door, such as a raised step or threshold. Trim any bushes or trees on the path to your home. Use bright outdoor lighting. Clear any walking paths of anything that might make someone trip, such as rocks or tools. Regularly check to see if handrails are loose or broken. Make sure that both sides of any steps have handrails. Any raised decks and porches should have guardrails on the edges. Have any leaves, snow, or ice cleared regularly. Use sand or salt on walking paths during winter. Clean up any spills in your garage right away. This includes oil or grease spills. What can I do in the bathroom? Use night lights. Install grab bars by the toilet and in the tub and shower. Do not use towel bars as grab bars. Use non-skid mats or decals in the tub or shower. If you need to sit down in the shower, use a plastic, non-slip stool. Keep the floor dry. Clean up any water that spills on the floor as soon as it happens. Remove soap buildup in the tub or shower regularly. Attach bath mats securely with  double-sided non-slip rug tape. Do not have throw rugs and other things on the floor that can make you trip. What can I do in the bedroom? Use night lights. Make sure that you have a light by your bed that is easy to reach. Do not use any sheets or blankets that are too big for your bed. They should not hang down onto the floor. Have a firm chair that has side arms. You can use this for support while you get dressed. Do not have throw rugs and other things on the floor that can make you trip. What can I do in the kitchen? Clean up any spills right away. Avoid walking on wet floors. Keep items that you use a lot in easy-to-reach places. If you need to reach something above you, use a strong step stool that has a grab bar. Keep electrical cords out of the way. Do not use floor polish or wax that makes floors slippery. If you must use wax, use non-skid floor wax. Do not have throw rugs and other things on the floor that can make you trip. What can I do with my stairs? Do not leave any items on the stairs. Make sure that there are handrails on both sides of the stairs and use them. Fix handrails that are broken or loose. Make sure that handrails are as long as the stairways. Check any carpeting to make  sure that it is firmly attached to the stairs. Fix any carpet that is loose or worn. Avoid having throw rugs at the top or bottom of the stairs. If you do have throw rugs, attach them to the floor with carpet tape. Make sure that you have a light switch at the top of the stairs and the bottom of the stairs. If you do not have them, ask someone to add them for you. What else can I do to help prevent falls? Wear shoes that: Do not have high heels. Have rubber bottoms. Are comfortable and fit you well. Are closed at the toe. Do not wear sandals. If you use a stepladder: Make sure that it is fully opened. Do not climb a closed stepladder. Make sure that both sides of the stepladder are locked  into place. Ask someone to hold it for you, if possible. Clearly mark and make sure that you can see: Any grab bars or handrails. First and last steps. Where the edge of each step is. Use tools that help you move around (mobility aids) if they are needed. These include: Canes. Walkers. Scooters. Crutches. Turn on the lights when you go into a dark area. Replace any light bulbs as soon as they burn out. Set up your furniture so you have a clear path. Avoid moving your furniture around. If any of your floors are uneven, fix them. If there are any pets around you, be aware of where they are. Review your medicines with your doctor. Some medicines can make you feel dizzy. This can increase your chance of falling. Ask your doctor what other things that you can do to help prevent falls. This information is not intended to replace advice given to you by your health care provider. Make sure you discuss any questions you have with your health care provider. Document Released: 02/01/2009 Document Revised: 09/13/2015 Document Reviewed: 05/12/2014 Elsevier Interactive Patient Education  2017 [employer].

## 2022-01-08 NOTE — Progress Notes (Signed)
Virtual Visit via Telephone Note  I connected with  Holli Humbles on 01/08/22 at  2:45 PM EDT by telephone and verified that I am speaking with the correct person using two identifiers.  Medicare Annual Wellness visit completed telephonically due to Covid-19 pandemic.   Persons participating in this call: This Health Coach and this patient.   Location: Patient: home Provider: office    I discussed the limitations, risks, security and privacy concerns of performing an evaluation and management service by telephone and the availability of in person appointments. The patient expressed understanding and agreed to proceed.  Unable to perform video visit due to video visit attempted and failed and/or patient does not have video capability.   Some vital signs may be absent or patient reported.   Marzella Schlein, LPN   Subjective:   Alvenia Treese is a 71 y.o. female who presents for Medicare Annual (Subsequent) preventive examination.  Review of Systems     Cardiac Risk Factors include: advanced age (>71men, >67 women)     Objective:    There were no vitals filed for this visit. There is no height or weight on file to calculate BMI.     01/08/2022    2:40 PM 12/20/2020    1:20 PM 04/11/2020    4:19 PM  Advanced Directives  Does Patient Have a Medical Advance Directive? Yes Yes No  Type of Estate agent of Institute;Living will Healthcare Power of [place];Living will   Copy of Healthcare Power of Attorney in Chart? No - copy requested No - copy requested     Current Medications (verified) Outpatient Encounter Medications as of 01/08/2022  Medication Sig   ALFALFA PO Take 3 tablets by mouth 2 (two) times daily.   Ascorbic Acid (VITAMIN C PO) Take 2 tablets by mouth 2 (two) times daily.   BIOTIN PO Take 1 tablet by mouth daily.   Cyanocobalamin (B-12 PO) Take 1 tablet by mouth daily.   HORSE CHESTNUT PO Take 1 tablet by mouth 2 (two) times daily.    Levomefolate Glucosamine (METHYLFOLATE PO) Take 1 tablet by mouth daily.   Multiple Minerals-Vitamins (CALCIUM CITRATE PLUS/MAGNESIUM PO) Take by mouth.   OVER THE COUNTER MEDICATION Take 3 drops by mouth daily. Iodine Detoxadine   Pyridoxine HCl (B-6 PO) Take 1 tablet by mouth daily.   TURMERIC CURCUMIN PO Take 2 tablets by mouth daily.   VITAMIN D-VITAMIN K PO Take 1 tablet by mouth daily.   acetaminophen (TYLENOL) 650 MG CR tablet Take 650 mg by mouth every 8 (eight) hours as needed for pain. (Patient not taking: Reported on 01/08/2022)   meclizine (ANTIVERT) 25 MG tablet Take 1 tablet (25 mg total) by mouth 3 (three) times daily as needed for dizziness. (Patient not taking: Reported on 01/08/2022)   No facility-administered encounter medications on file as of 01/08/2022.    Allergies (verified) Patient has no known allergies.   History: Past Medical History:  Diagnosis Date   Colon cancer screening 05/2018   Cologuard neg 05/2018; repeat 2023.   GERD (gastroesophageal reflux disease)    History of kidney stones    Hypercholesterolemia 02/2017   LDL > 200. Pt chose [facility] and LDL decreased to 130 in 2 months.  Rpt 05/2018: LDL 170s, HDL 80->Fram rx = 4.5%->continue TLC. Stable (10 yr frmhm cv risk 6.5%) 05/2020 on [facility].   Left inguinal hernia 11/2016   Watchful waiting approach   Osteoporosis 11/04/2018   T score -3.2; recommended fosamax  10/2018. T score 09/2021 -3.3   Prediabetes 05/2019   2021 A1c 6.1%.  Feb 2022 a1c 5.8%.   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   Past Surgical History:  Procedure Laterality Date   COLONOSCOPY     X 2.  Most recent ? 20014?  No abnormals.   DEXA  11/04/2018   T score -3.2 spine   LAPAROSCOPY  1981   "for possible infertility"    TONSILLECTOMY  1960   Family History  Problem Relation Age of Onset   Arthritis Mother    Stroke Mother    Kidney disease Mother    Hyperlipidemia Father    Heart disease Father    Diabetes Father    Stroke Brother    Galactosemia Son    Lymphoma Paternal Grandmother    Diabetes Paternal Grandfather    Social History   Socioeconomic History   Marital status: Widowed    Spouse name: Not on file   Number of children: Not on file   Years of education: Not on file   Highest education level: Not on file  Occupational History   Not on file  Tobacco Use   Smoking status: Never   Smokeless tobacco: Never  Vaping Use   Vaping Use: Never used  Substance and Sexual Activity   Alcohol use: Yes    Alcohol/week: 3.0 standard drinks of alcohol    Types: 3 Glasses of wine per week   Drug use: No   Sexual activity: Not Currently  Other Topics Concern   Not on file  Social History Narrative   Widow (husba d cancer), 1 daughter and one son.   Educ: nursing school.   Occup: retired--hx of LPN work, sign Engineer, technical sales.  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   Moved from [place] 06/2016.   Alc: 4 glasses red wine per week.   No tobacco or drugs.   Social Determinants of Health   Financial Resource Strain: Low Risk  (01/08/2022)   Overall Financial Resource Strain (CARDIA)    Difficulty of Paying Living Expenses: Not hard at all  Food Insecurity: No Food Insecurity (01/08/2022)   Hunger Vital Sign    Worried About Running Out of Food in the Last Year: Never true    Ran Out of Food in the Last Year: Never true  Transportation Needs: No Transportation Needs (01/08/2022)   PRAPARE - Administrator, Civil Service (Medical): No    Lack of Transportation (Non-Medical): No  Physical Activity: Sufficiently Active (01/08/2022)   Exercise Vital Sign    Days of Exercise per Week: 5 days    Minutes of Exercise per Session: 30 min  Stress: No Stress Concern Present (01/08/2022)   [employer] of Occupational Health - Occupational Stress Questionnaire    Feeling of Stress : Not at all  Social Connections: Moderately Isolated (01/08/2022)   Social Connection and Isolation Panel [NHANES]    Frequency of Communication with Friends and Family: More than three times a week    Frequency of Social Gatherings with Friends and Family: More than three times a week    Attends Religious Services: More than 4 times per year    Active Member of Clubs or  Organizations: No    Attends Banker Meetings: Never    Marital Status: Widowed    Tobacco Counseling Counseling given: Not Answered   Clinical Intake:  Pre-visit preparation completed: Yes  Pain : No/denies pain     BMI - recorded: 19.29 Nutritional Status: BMI <19  Underweight Nutritional Risks: None Diabetes: No  How often do you need to have someone help you when you read instructions, pamphlets, or other written materials from your doctor or pharmacy?: 1 - Never  Diabetic?no  Interpreter Needed?: No  Information entered by :: Lanier Ensign, LPN   Activities of Daily Living    01/08/2022    2:41 PM  In your present state of health, do you have any difficulty performing the following activities:  Hearing? 1  Comment slight loss  Vision? 0  Difficulty concentrating or making decisions? 0  Walking or climbing stairs? 0  Dressing or bathing? 0  Doing errands, shopping? 0  Preparing Food and eating ? N  Using the Toilet? N  In the past six months, have you accidently leaked urine? N  Do you have problems with loss of bowel control? N  Managing your Medications? N  Managing your Finances? N  Housekeeping or managing your Housekeeping? N    Patient Care Team: Jeoffrey Massed, MD as PCP - General (Family Medicine) Serena Colonel, MD as Consulting Physician (Otolaryngology)  Indicate any recent Medical Services you may have received from other than [facility] providers in the past year (date may  be approximate).     Assessment:   This is a routine wellness examination for [place].  Hearing/Vision screen Hearing Screening - Comments:: Pt stated slight hearing loss Vision Screening - Comments:: Pt follows up with new provider related to insurance   Dietary issues and exercise activities discussed: Current Exercise Habits: Home exercise routine, Type of exercise: walking, Time (Minutes): 30, Frequency (Times/Week): 5, Weekly Exercise (Minutes/Week): 150   Goals Addressed             This Visit's Progress    Patient Stated       Gain a little weight        Depression Screen    01/08/2022    2:39 PM 12/20/2020    1:24 PM 06/13/2020    8:22 AM 06/13/2019    8:20 AM 06/07/2018    9:37 AM 12/16/2016    8:47 AM  PHQ 2/9 Scores  PHQ - 2 Score 0 0 0 0 0 0    Fall Risk    01/08/2022    2:41 PM 12/20/2020    1:18 PM 06/13/2020    8:22 AM 06/13/2019    8:20 AM 06/07/2018    9:37 AM  Fall Risk   Falls in the past year? 0 0 0 0 0  Number falls in past yr: 0 0 0 0   Injury with Fall? 0 0 0 0   Risk for fall due to : No Fall Risks;Impaired vision      Follow up Falls prevention discussed Falls evaluation completed;Falls prevention discussed Falls evaluation completed Falls evaluation completed     FALL RISK PREVENTION PERTAINING TO THE HOME:  Any stairs in or around the home? Yes  If so, are there any without handrails? No  Home free of loose throw rugs in walkways, pet beds, electrical cords, etc? Yes  Adequate lighting in your home to reduce risk of falls? Yes   ASSISTIVE DEVICES UTILIZED TO PREVENT FALLS:  Life  alert? No  Use of a cane, walker or w/c? No  Grab bars in the bathroom? Yes  Shower chair or bench in shower? yes Elevated toilet seat or a handicapped toilet? No   TIMED UP AND GO:  Was the test performed? No .   Cognitive Function:        01/08/2022    2:42 PM  6CIT Screen  What Year? 0 points  What month? 0 points  What time? 0 points  Count  back from 20 0 points  Months in reverse 0 points  Repeat phrase 0 points  Total Score 0 points    Immunizations  There is no immunization history on file for this patient.  TDAP status: Due, Education has been provided regarding the importance of this vaccine. Advised may receive this vaccine at local pharmacy or Health Dept. Aware to provide a copy of the vaccination record if obtained from local pharmacy or Health Dept. Verbalized acceptance and understanding.  Flu Vaccine status: Declined, Education has been provided regarding the importance of this vaccine but patient still declined. Advised may receive this vaccine at local pharmacy or Health Dept. Aware to provide a copy of the vaccination record if obtained from local pharmacy or Health Dept. Verbalized acceptance and understanding.  Pneumococcal vaccine status: Declined,  Education has been provided regarding the importance of this vaccine but patient still declined. Advised may receive this vaccine at local pharmacy or Health Dept. Aware to provide a copy of the vaccination record if obtained from local pharmacy or Health Dept. Verbalized acceptance and understanding.   Covid-19 vaccine status: Declined, Education has been provided regarding the importance of this vaccine but patient still declined. Advised may receive this vaccine at local pharmacy or Health Dept.or vaccine clinic. Aware to provide a copy of the vaccination record if obtained from local pharmacy or Health Dept. Verbalized acceptance and understanding.  Qualifies for Shingles Vaccine? Yes   Zostavax completed No   Shingrix Completed?: No.    Education has been provided regarding the importance of this vaccine. Patient has been advised to call insurance company to determine out of pocket expense if they have not yet received this vaccine. Advised may also receive vaccine at local pharmacy or Health Dept. Verbalized acceptance and understanding.  Screening Tests Health  Maintenance  Topic Date Due   COVID-19 Vaccine (1) Never done   TETANUS/TDAP  Never done   Zoster Vaccines- Shingrix (1 of 2) Never done   Pneumonia Vaccine 6565+ Years old (1 - PCV) Never done   INFLUENZA VACCINE  Never done   MAMMOGRAM  10/03/2023   Fecal DNA (Cologuard)  07/01/2024   DEXA SCAN  Completed   HPV VACCINES  Aged Out   Hepatitis C Screening  Discontinued    Health Maintenance  Health Maintenance Due  Topic Date Due   COVID-19 Vaccine (1) Never done   TETANUS/TDAP  Never done   Zoster Vaccines- Shingrix (1 of 2) Never done   Pneumonia Vaccine 3365+ Years old (1 - PCV) Never done   INFLUENZA VACCINE  Never done    Colorectal cancer screening: Type of screening: Cologuard. Completed 07/01/21. Repeat every 3 years  Mammogram status: Completed 10/02/21. Repeat every year  Bone Density status: Completed 10/02/21. Results reflect: Bone density results: OSTEOPOROSIS. Repeat every 2 years.   Additional Screening:  Hepatitis C Screening: does not qualify;  Vision Screening: Recommended annual ophthalmology exams for early detection of glaucoma and other disorders of the eye. Is the  patient up to date with their annual eye exam?  Yes  Who is the provider or what is the name of the office in which the patient attends annual eye exams? Will need a new provider  If pt is not established with a provider, would they like to be referred to a provider to establish care? No .   Dental Screening: Recommended annual dental exams for proper oral hygiene  Community Resource Referral / Chronic Care Management: CRR required this visit?  No   CCM required this visit?  No      Plan:     I have personally reviewed and noted the following in the patient's chart:   Medical and social history Use of alcohol, tobacco or illicit drugs  Current medications and supplements including opioid prescriptions. Patient is not currently taking opioid prescriptions. Functional ability and  status Nutritional status Physical activity Advanced directives List of other physicians Hospitalizations, surgeries, and ER visits in previous 12 months Vitals Screenings to include cognitive, depression, and falls Referrals and appointments  In addition, I have reviewed and discussed with patient certain preventive protocols, quality metrics, and best practice recommendations. A written personalized care plan for preventive services as well as general preventive health recommendations were provided to patient.     Willette Brace, LPN   9/32/[id number]   Nurse Notes: none

## 2022-01-16 DIAGNOSIS — S336XXA Sprain of sacroiliac joint, initial encounter: Secondary | ICD-10-CM | POA: Diagnosis not present

## 2022-01-16 DIAGNOSIS — M9901 Segmental and somatic dysfunction of cervical region: Secondary | ICD-10-CM | POA: Diagnosis not present

## 2022-01-16 DIAGNOSIS — M47816 Spondylosis without myelopathy or radiculopathy, lumbar region: Secondary | ICD-10-CM | POA: Diagnosis not present

## 2022-01-16 DIAGNOSIS — M5116 Intervertebral disc disorders with radiculopathy, lumbar region: Secondary | ICD-10-CM | POA: Diagnosis not present

## 2022-01-16 DIAGNOSIS — M9903 Segmental and somatic dysfunction of lumbar region: Secondary | ICD-10-CM | POA: Diagnosis not present

## 2022-01-16 DIAGNOSIS — M9902 Segmental and somatic dysfunction of thoracic region: Secondary | ICD-10-CM | POA: Diagnosis not present

## 2022-01-16 DIAGNOSIS — M4723 Other spondylosis with radiculopathy, cervicothoracic region: Secondary | ICD-10-CM | POA: Diagnosis not present

## 2022-01-16 DIAGNOSIS — M47814 Spondylosis without myelopathy or radiculopathy, thoracic region: Secondary | ICD-10-CM | POA: Diagnosis not present

## 2022-01-16 DIAGNOSIS — M4724 Other spondylosis with radiculopathy, thoracic region: Secondary | ICD-10-CM | POA: Diagnosis not present

## 2022-01-16 DIAGNOSIS — M47813 Spondylosis without myelopathy or radiculopathy, cervicothoracic region: Secondary | ICD-10-CM | POA: Diagnosis not present

## 2022-01-16 DIAGNOSIS — M9904 Segmental and somatic dysfunction of sacral region: Secondary | ICD-10-CM | POA: Diagnosis not present

## 2022-02-17 DIAGNOSIS — M4724 Other spondylosis with radiculopathy, thoracic region: Secondary | ICD-10-CM | POA: Diagnosis not present

## 2022-02-17 DIAGNOSIS — M9903 Segmental and somatic dysfunction of lumbar region: Secondary | ICD-10-CM | POA: Diagnosis not present

## 2022-02-17 DIAGNOSIS — M4723 Other spondylosis with radiculopathy, cervicothoracic region: Secondary | ICD-10-CM | POA: Diagnosis not present

## 2022-02-17 DIAGNOSIS — M47813 Spondylosis without myelopathy or radiculopathy, cervicothoracic region: Secondary | ICD-10-CM | POA: Diagnosis not present

## 2022-02-17 DIAGNOSIS — M9901 Segmental and somatic dysfunction of cervical region: Secondary | ICD-10-CM | POA: Diagnosis not present

## 2022-02-17 DIAGNOSIS — M9902 Segmental and somatic dysfunction of thoracic region: Secondary | ICD-10-CM | POA: Diagnosis not present

## 2022-02-17 DIAGNOSIS — M5116 Intervertebral disc disorders with radiculopathy, lumbar region: Secondary | ICD-10-CM | POA: Diagnosis not present

## 2022-02-17 DIAGNOSIS — S336XXA Sprain of sacroiliac joint, initial encounter: Secondary | ICD-10-CM | POA: Diagnosis not present

## 2022-02-17 DIAGNOSIS — M47816 Spondylosis without myelopathy or radiculopathy, lumbar region: Secondary | ICD-10-CM | POA: Diagnosis not present

## 2022-02-17 DIAGNOSIS — M9904 Segmental and somatic dysfunction of sacral region: Secondary | ICD-10-CM | POA: Diagnosis not present

## 2022-02-17 DIAGNOSIS — M47814 Spondylosis without myelopathy or radiculopathy, thoracic region: Secondary | ICD-10-CM | POA: Diagnosis not present

## 2022-03-17 DIAGNOSIS — M9901 Segmental and somatic dysfunction of cervical region: Secondary | ICD-10-CM | POA: Diagnosis not present

## 2022-03-17 DIAGNOSIS — M9902 Segmental and somatic dysfunction of thoracic region: Secondary | ICD-10-CM | POA: Diagnosis not present

## 2022-03-17 DIAGNOSIS — M47816 Spondylosis without myelopathy or radiculopathy, lumbar region: Secondary | ICD-10-CM | POA: Diagnosis not present

## 2022-03-17 DIAGNOSIS — M47814 Spondylosis without myelopathy or radiculopathy, thoracic region: Secondary | ICD-10-CM | POA: Diagnosis not present

## 2022-03-17 DIAGNOSIS — M5116 Intervertebral disc disorders with radiculopathy, lumbar region: Secondary | ICD-10-CM | POA: Diagnosis not present

## 2022-03-17 DIAGNOSIS — S336XXA Sprain of sacroiliac joint, initial encounter: Secondary | ICD-10-CM | POA: Diagnosis not present

## 2022-03-17 DIAGNOSIS — M9903 Segmental and somatic dysfunction of lumbar region: Secondary | ICD-10-CM | POA: Diagnosis not present

## 2022-03-17 DIAGNOSIS — M4723 Other spondylosis with radiculopathy, cervicothoracic region: Secondary | ICD-10-CM | POA: Diagnosis not present

## 2022-03-17 DIAGNOSIS — M47813 Spondylosis without myelopathy or radiculopathy, cervicothoracic region: Secondary | ICD-10-CM | POA: Diagnosis not present

## 2022-03-17 DIAGNOSIS — M4724 Other spondylosis with radiculopathy, thoracic region: Secondary | ICD-10-CM | POA: Diagnosis not present

## 2022-03-17 DIAGNOSIS — M9904 Segmental and somatic dysfunction of sacral region: Secondary | ICD-10-CM | POA: Diagnosis not present

## 2022-04-23 DIAGNOSIS — M9901 Segmental and somatic dysfunction of cervical region: Secondary | ICD-10-CM | POA: Diagnosis not present

## 2022-04-23 DIAGNOSIS — M4723 Other spondylosis with radiculopathy, cervicothoracic region: Secondary | ICD-10-CM | POA: Diagnosis not present

## 2022-04-23 DIAGNOSIS — M47814 Spondylosis without myelopathy or radiculopathy, thoracic region: Secondary | ICD-10-CM | POA: Diagnosis not present

## 2022-04-23 DIAGNOSIS — S336XXA Sprain of sacroiliac joint, initial encounter: Secondary | ICD-10-CM | POA: Diagnosis not present

## 2022-04-23 DIAGNOSIS — M47816 Spondylosis without myelopathy or radiculopathy, lumbar region: Secondary | ICD-10-CM | POA: Diagnosis not present

## 2022-04-23 DIAGNOSIS — M9904 Segmental and somatic dysfunction of sacral region: Secondary | ICD-10-CM | POA: Diagnosis not present

## 2022-04-23 DIAGNOSIS — M9903 Segmental and somatic dysfunction of lumbar region: Secondary | ICD-10-CM | POA: Diagnosis not present

## 2022-04-23 DIAGNOSIS — M4724 Other spondylosis with radiculopathy, thoracic region: Secondary | ICD-10-CM | POA: Diagnosis not present

## 2022-04-23 DIAGNOSIS — M5116 Intervertebral disc disorders with radiculopathy, lumbar region: Secondary | ICD-10-CM | POA: Diagnosis not present

## 2022-04-23 DIAGNOSIS — M9902 Segmental and somatic dysfunction of thoracic region: Secondary | ICD-10-CM | POA: Diagnosis not present

## 2022-04-23 DIAGNOSIS — M47813 Spondylosis without myelopathy or radiculopathy, cervicothoracic region: Secondary | ICD-10-CM | POA: Diagnosis not present

## 2022-05-20 DIAGNOSIS — M9903 Segmental and somatic dysfunction of lumbar region: Secondary | ICD-10-CM | POA: Diagnosis not present

## 2022-05-20 DIAGNOSIS — M47816 Spondylosis without myelopathy or radiculopathy, lumbar region: Secondary | ICD-10-CM | POA: Diagnosis not present

## 2022-05-20 DIAGNOSIS — M4723 Other spondylosis with radiculopathy, cervicothoracic region: Secondary | ICD-10-CM | POA: Diagnosis not present

## 2022-05-20 DIAGNOSIS — M5116 Intervertebral disc disorders with radiculopathy, lumbar region: Secondary | ICD-10-CM | POA: Diagnosis not present

## 2022-05-20 DIAGNOSIS — M4724 Other spondylosis with radiculopathy, thoracic region: Secondary | ICD-10-CM | POA: Diagnosis not present

## 2022-05-20 DIAGNOSIS — M9902 Segmental and somatic dysfunction of thoracic region: Secondary | ICD-10-CM | POA: Diagnosis not present

## 2022-05-20 DIAGNOSIS — M9904 Segmental and somatic dysfunction of sacral region: Secondary | ICD-10-CM | POA: Diagnosis not present

## 2022-05-20 DIAGNOSIS — S336XXA Sprain of sacroiliac joint, initial encounter: Secondary | ICD-10-CM | POA: Diagnosis not present

## 2022-05-20 DIAGNOSIS — M47813 Spondylosis without myelopathy or radiculopathy, cervicothoracic region: Secondary | ICD-10-CM | POA: Diagnosis not present

## 2022-05-20 DIAGNOSIS — M47814 Spondylosis without myelopathy or radiculopathy, thoracic region: Secondary | ICD-10-CM | POA: Diagnosis not present

## 2022-05-20 DIAGNOSIS — M9901 Segmental and somatic dysfunction of cervical region: Secondary | ICD-10-CM | POA: Diagnosis not present

## 2022-06-13 NOTE — Patient Instructions (Signed)

## 2022-06-16 ENCOUNTER — Encounter: Payer: Self-pay | Admitting: Family Medicine

## 2022-06-16 ENCOUNTER — Ambulatory Visit (INDEPENDENT_AMBULATORY_CARE_PROVIDER_SITE_OTHER): Payer: Medicare HMO | Admitting: Family Medicine

## 2022-06-16 VITALS — BP 108/60 | HR 62 | Ht 65.75 in | Wt 108.2 lb

## 2022-06-16 DIAGNOSIS — R7303 Prediabetes: Secondary | ICD-10-CM

## 2022-06-16 DIAGNOSIS — E7800 Pure hypercholesterolemia, unspecified: Secondary | ICD-10-CM

## 2022-06-16 DIAGNOSIS — Z0000 Encounter for general adult medical examination without abnormal findings: Secondary | ICD-10-CM | POA: Diagnosis not present

## 2022-06-16 NOTE — Progress Notes (Signed)
Office Note 06/16/2022  CC:  Chief Complaint  Patient presents with   Annual Exam    Pt is fasting    HPI:  Patient is a 72 y.o. female who is here for annual health maintenance exam and follow-up hypercholesterolemia and prediabetes.  Katherine Villanueva feels well. She keeps her 51-year-old grandchild 9-5 Monday through Friday and enjoys this, keeps her very busy all the time. She does eat 3 meals a day but tries very hard to limit carbohydrates and fats, admits she needs to eat more.  Appetite is good, though.  Energy level great.   Past Medical History:  Diagnosis Date   Colon cancer screening 05/2018   Cologuard neg 05/2018; repeat 2023.   GERD (gastroesophageal reflux disease)    History of kidney stones    Hypercholesterolemia 02/2017   LDL > 200. Pt chose TLC and LDL decreased to 130 in 2 months.  Rpt 05/2018: LDL 170s, HDL 80->Fram rx = 4.5%->continue TLC. Stable (10 yr frmhm cv risk 6.5%) 05/2020 on TLC.   Left inguinal hernia 11/2016   Watchful waiting approach   Osteoporosis 11/04/2018   T score -3.2; recommended fosamax 10/2018. T score 09/2021 -3.3   Prediabetes 05/2019   2021 A1c 6.1%.  Feb 2022 a1c 5.8%.   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    Past Surgical History:  Procedure Laterality Date   COLONOSCOPY     X 2.  Most recent ? 20014?  No abnormals.   DEXA  11/04/2018   T score -3.2 spine   LAPAROSCOPY  1981   "for possible infertility"   TONSILLECTOMY  1960    Family History  Problem Relation Age of Onset   Arthritis Mother    Stroke Mother    Kidney disease Mother    Hyperlipidemia Father    Heart disease Father    Diabetes Father    Stroke Brother    Galactosemia Son    Lymphoma Paternal Grandmother    Diabetes Paternal  Grandfather     Social History   Socioeconomic History   Marital status: Widowed    Spouse name: Not on file   Number of children: Not on file   Years of education: Not on file   Highest education level: Not on file  Occupational History   Not on file  Tobacco Use   Smoking status: Never   Smokeless tobacco: Never  Vaping Use   Vaping Use: Never used  Substance and Sexual Activity   Alcohol use: Yes    Alcohol/week: 3.0 standard drinks of alcohol    Types: 3 Glasses of wine per week   Drug use: No   Sexual activity: Not Currently  Other Topics Concern   Not on file  Social History Narrative   Widow (husba d cancer), 1 daughter and one son.   Educ: nursing school.   Occup: retired--hx of LPN work, sign Sport and exercise psychologist.  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   Moved from [place] 06/2016.   Alc: 4 glasses red wine per week.   No tobacco or drugs.   Social Determinants of Health   Financial Resource Strain: Low Risk  (01/08/2022)   Overall Financial Resource Strain (CARDIA)    Difficulty of Paying  Living Expenses: Not hard at all  Food Insecurity: No Food Insecurity (01/08/2022)   Hunger Vital Sign    Worried About Running Out of Food in the Last Year: Never true    Ran Out of Food in the Last Year: Never true  Transportation Needs: No Transportation Needs (01/08/2022)   PRAPARE - Hydrologist (Medical): No    Lack of Transportation (Non-Medical): No  Physical Activity: Sufficiently Active (01/08/2022)   Exercise Vital Sign    Days of Exercise per Week: 5 days    Minutes of Exercise per Session: 30 min  Stress: No Stress Concern Present (01/08/2022)   [place]    Feeling of Stress : Not at all  Social Connections: Moderately Isolated (01/08/2022)   Social Connection and Isolation Panel [NHANES]    Frequency of Communication with Friends and Family: More than three times a week     Frequency of Social Gatherings with Friends and Family: More than three times a week    Attends Religious Services: More than 4 times per year    Active Member of [employer] or Organizations: No    Attends Archivist Meetings: Never    Marital Status: Widowed  Intimate Partner Violence: Not At Risk (01/08/2022)   Humiliation, Afraid, Rape, and Kick questionnaire    Fear of Current or Ex-Partner: No    Emotionally Abused: No    Physically Abused: No    Sexually Abused: No    Outpatient Medications Prior to Visit  Medication Sig Dispense Refill   acetaminophen (TYLENOL) 650 MG CR tablet Take 650 mg by mouth every 8 (eight) hours as needed for pain. (Patient not taking: Reported on 01/08/2022)     ALFALFA PO Take 3 tablets by mouth 2 (two) times daily.     Ascorbic Acid (VITAMIN C PO) Take 2 tablets by mouth 2 (two) times daily.     BIOTIN PO Take 1 tablet by mouth daily.     Cyanocobalamin (B-12 PO) Take 1 tablet by mouth daily.     HORSE CHESTNUT PO Take 1 tablet by mouth 2 (two) times daily.     Levomefolate Glucosamine (METHYLFOLATE PO) Take 1 tablet by mouth daily.     meclizine (ANTIVERT) 25 MG tablet Take 1 tablet (25 mg total) by mouth 3 (three) times daily as needed for dizziness. (Patient not taking: Reported on 01/08/2022) 30 tablet 0   Multiple Minerals-Vitamins (CALCIUM CITRATE PLUS/MAGNESIUM PO) Take by mouth.     OVER THE COUNTER MEDICATION Take 3 drops by mouth daily. Iodine Detoxadine     Pyridoxine HCl (B-6 PO) Take 1 tablet by mouth daily.     TURMERIC CURCUMIN PO Take 2 tablets by mouth daily.     VITAMIN D-VITAMIN K PO Take 1 tablet by mouth daily.     No facility-administered medications prior to visit.    No Known Allergies  Review of Systems  Constitutional:  Negative for appetite change, chills, fatigue and fever.  HENT:  Negative for congestion, dental problem, ear pain and sore throat.   Eyes:  Negative for discharge, redness and visual disturbance.   Respiratory:  Negative for cough, chest tightness, shortness of breath and wheezing.   Cardiovascular:  Negative for chest pain, palpitations and leg swelling.  Gastrointestinal:  Negative for abdominal pain, blood in stool, diarrhea, nausea and vomiting.  Genitourinary:  Negative for difficulty urinating, dysuria, flank pain, frequency, hematuria and urgency.  Musculoskeletal:  Negative for arthralgias, back pain, joint swelling, myalgias and neck stiffness.  Skin:  Negative for pallor and rash.  Neurological:  Negative for dizziness, speech difficulty, weakness and headaches.  Hematological:  Negative for adenopathy. Does not bruise/bleed easily.  Psychiatric/Behavioral:  Negative for confusion and sleep disturbance. The patient is not nervous/anxious.     PE;    06/16/2022    7:54 AM 06/13/2021    8:08 AM 06/13/2020    8:33 AM  Vitals with BMI  Height 5' 5.75" 5' 4.961" 5' 2.25"  Weight 108 lbs 3 oz 115 lbs 13 oz 115 lbs 3 oz  BMI 17.6 [id number]  Systolic [id number]  Diastolic 60 57 74  Pulse 62 73 66   Exam chaperoned by Deveron Furlong, CMA. Gen: Alert, well appearing.  Patient is oriented to person, place, time, and situation. AFFECT: pleasant, lucid thought and speech. ENT: Ears: EACs clear, normal epithelium.  TMs with good light reflex and landmarks bilaterally.  Eyes: no injection, icteris, swelling, or exudate.  EOMI, PERRLA. Nose: no drainage or turbinate edema/swelling.  No injection or focal lesion.  Mouth: lips without lesion/swelling.  Oral mucosa pink and moist.  Dentition intact and without obvious caries or gingival swelling.  Oropharynx without erythema, exudate, or swelling.  Neck: supple/nontender.  No LAD, mass, or TM.  Carotid pulses 2+ bilaterally, without bruits. CV: RRR, no m/r/g.   LUNGS: CTA bilat, nonlabored resps, good aeration in all lung fields. ABD: soft, NT, ND, BS normal.  No hepatospenomegaly or mass.  No bruits. EXT: no clubbing, cyanosis, or  edema.  Musculoskeletal: no joint swelling, erythema, warmth, or tenderness.  ROM of all joints intact. Skin - no sores or suspicious lesions or rashes or color changes  Pertinent labs:  Lab Results  Component Value Date   TSH 4.83 06/13/2021   Lab Results  Component Value Date   WBC 5.8 06/13/2021   HGB 13.7 06/13/2021   HCT 40.7 06/13/2021   MCV 95.4 06/13/2021   PLT 225.0 06/13/2021   Lab Results  Component Value Date   CREATININE 0.74 06/13/2021   BUN 19 06/13/2021   NA 140 06/13/2021   K 4.1 06/13/2021   CL 103 06/13/2021   CO2 31 06/13/2021   Lab Results  Component Value Date   ALT 15 06/13/2021   AST 17 06/13/2021   ALKPHOS 53 06/13/2021   BILITOT 1.1 06/13/2021   Lab Results  Component Value Date   CHOL 260 (H) 06/13/2021   Lab Results  Component Value Date   HDL 81.20 06/13/2021   Lab Results  Component Value Date   LDLCALC 167 (H) 06/13/2021   Lab Results  Component Value Date   TRIG 58.0 06/13/2021   Lab Results  Component Value Date   CHOLHDL 3 06/13/2021   Lab Results  Component Value Date   HGBA1C 6.1 06/13/2021   ASSESSMENT AND PLAN:   Health maintenance exam: Reviewed age and gender appropriate health maintenance issues (prudent diet, regular exercise, health risks of tobacco and excessive alcohol, use of seatbelts, fire alarms in home, use of sunscreen).  Also reviewed age and gender appropriate health screening as well as vaccine recommendations. Vaccines: Tdap, pneumovax, shingrix, flu--> Pt declines. Labs: cbc, tsh, CMET, FLP, Hba1c today (fasting). Cervical ca screening: no further screening indicated. Breast ca screening: mammogram NEG 10/02/2021.  Repeat June 2024. Colon ca screening: Cologuard NEG 06/2021.  Repeat 06/2024. Osteoporosis: DEXA in June 2023 showed T-score -3.3.  She has declined medication treatment for this-->she had a friend who took med for this and got a femur fracture. Cont ca++ and vit D and good wt bearing  exercise habits.  An After Visit Summary was printed and given to the patient.  FOLLOW UP:  No follow-ups on file.  Signed:  Crissie Sickles, MD           06/16/2022

## 2022-06-17 LAB — CBC
HCT: 45 % (ref 35.0–45.0)
Hemoglobin: 15 g/dL (ref 11.7–15.5)
MCH: 31.8 pg (ref 27.0–33.0)
MCHC: 33.3 g/dL (ref 32.0–36.0)
MCV: 95.3 fL (ref 80.0–100.0)
MPV: 10.5 fL (ref 7.5–12.5)
Platelets: 245 10*3/uL (ref 140–400)
RBC: 4.72 10*6/uL (ref 3.80–5.10)
RDW: 11.8 % (ref 11.0–15.0)
WBC: 5.3 10*3/uL (ref 3.8–10.8)

## 2022-06-17 LAB — COMPREHENSIVE METABOLIC PANEL
AG Ratio: 1.5 (calc) (ref 1.0–2.5)
ALT: 20 U/L (ref 6–29)
AST: 21 U/L (ref 10–35)
Albumin: 4.3 g/dL (ref 3.6–5.1)
Alkaline phosphatase (APISO): 62 U/L (ref 37–153)
BUN: 23 mg/dL (ref 7–25)
CO2: 30 mmol/L (ref 20–32)
Calcium: 9.7 mg/dL (ref 8.6–10.4)
Chloride: 104 mmol/L (ref 98–110)
Creat: 0.68 mg/dL (ref 0.60–1.00)
Globulin: 2.8 g/dL (calc) (ref 1.9–3.7)
Glucose, Bld: 96 mg/dL (ref 65–99)
Potassium: 4.2 mmol/L (ref 3.5–5.3)
Sodium: 141 mmol/L (ref 135–146)
Total Bilirubin: 0.9 mg/dL (ref 0.2–1.2)
Total Protein: 7.1 g/dL (ref 6.1–8.1)

## 2022-06-17 LAB — LIPID PANEL
Cholesterol: 327 mg/dL — ABNORMAL HIGH (ref <200)
HDL: 80 mg/dL (ref >=50)
LDL Cholesterol (Calc): 231 mg/dL (calc) — ABNORMAL HIGH
Non-HDL Cholesterol (Calc): 247 mg/dL (calc) — ABNORMAL HIGH (ref <130)
Total CHOL/HDL Ratio: 4.1 (calc) (ref <5.0)
Triglycerides: 57 mg/dL (ref <150)

## 2022-06-17 LAB — TSH: TSH: 3.84 mIU/L (ref 0.40–4.50)

## 2022-06-17 LAB — HEMOGLOBIN A1C
Hgb A1c MFr Bld: 6 % of total Hgb — ABNORMAL HIGH (ref <5.7)
Mean Plasma Glucose: 126 mg/dL
eAG (mmol/L): 7 mmol/L

## 2022-06-19 ENCOUNTER — Encounter: Payer: Self-pay | Admitting: Family Medicine

## 2022-06-23 DIAGNOSIS — M47814 Spondylosis without myelopathy or radiculopathy, thoracic region: Secondary | ICD-10-CM | POA: Diagnosis not present

## 2022-06-23 DIAGNOSIS — M5116 Intervertebral disc disorders with radiculopathy, lumbar region: Secondary | ICD-10-CM | POA: Diagnosis not present

## 2022-06-23 DIAGNOSIS — S336XXA Sprain of sacroiliac joint, initial encounter: Secondary | ICD-10-CM | POA: Diagnosis not present

## 2022-06-23 DIAGNOSIS — M47813 Spondylosis without myelopathy or radiculopathy, cervicothoracic region: Secondary | ICD-10-CM | POA: Diagnosis not present

## 2022-06-23 DIAGNOSIS — M9903 Segmental and somatic dysfunction of lumbar region: Secondary | ICD-10-CM | POA: Diagnosis not present

## 2022-06-23 DIAGNOSIS — M9901 Segmental and somatic dysfunction of cervical region: Secondary | ICD-10-CM | POA: Diagnosis not present

## 2022-06-23 DIAGNOSIS — M4724 Other spondylosis with radiculopathy, thoracic region: Secondary | ICD-10-CM | POA: Diagnosis not present

## 2022-06-23 DIAGNOSIS — M9902 Segmental and somatic dysfunction of thoracic region: Secondary | ICD-10-CM | POA: Diagnosis not present

## 2022-06-23 DIAGNOSIS — M47816 Spondylosis without myelopathy or radiculopathy, lumbar region: Secondary | ICD-10-CM | POA: Diagnosis not present

## 2022-06-23 DIAGNOSIS — M4723 Other spondylosis with radiculopathy, cervicothoracic region: Secondary | ICD-10-CM | POA: Diagnosis not present

## 2022-06-23 DIAGNOSIS — M9904 Segmental and somatic dysfunction of sacral region: Secondary | ICD-10-CM | POA: Diagnosis not present

## 2022-06-26 DIAGNOSIS — H2513 Age-related nuclear cataract, bilateral: Secondary | ICD-10-CM | POA: Diagnosis not present

## 2022-07-28 DIAGNOSIS — M9901 Segmental and somatic dysfunction of cervical region: Secondary | ICD-10-CM | POA: Diagnosis not present

## 2022-07-28 DIAGNOSIS — M9904 Segmental and somatic dysfunction of sacral region: Secondary | ICD-10-CM | POA: Diagnosis not present

## 2022-07-28 DIAGNOSIS — M47813 Spondylosis without myelopathy or radiculopathy, cervicothoracic region: Secondary | ICD-10-CM | POA: Diagnosis not present

## 2022-07-28 DIAGNOSIS — M47814 Spondylosis without myelopathy or radiculopathy, thoracic region: Secondary | ICD-10-CM | POA: Diagnosis not present

## 2022-07-28 DIAGNOSIS — M47816 Spondylosis without myelopathy or radiculopathy, lumbar region: Secondary | ICD-10-CM | POA: Diagnosis not present

## 2022-07-28 DIAGNOSIS — M4723 Other spondylosis with radiculopathy, cervicothoracic region: Secondary | ICD-10-CM | POA: Diagnosis not present

## 2022-07-28 DIAGNOSIS — M9903 Segmental and somatic dysfunction of lumbar region: Secondary | ICD-10-CM | POA: Diagnosis not present

## 2022-07-28 DIAGNOSIS — S336XXA Sprain of sacroiliac joint, initial encounter: Secondary | ICD-10-CM | POA: Diagnosis not present

## 2022-07-28 DIAGNOSIS — M5116 Intervertebral disc disorders with radiculopathy, lumbar region: Secondary | ICD-10-CM | POA: Diagnosis not present

## 2022-07-28 DIAGNOSIS — M9902 Segmental and somatic dysfunction of thoracic region: Secondary | ICD-10-CM | POA: Diagnosis not present

## 2022-07-28 DIAGNOSIS — M4724 Other spondylosis with radiculopathy, thoracic region: Secondary | ICD-10-CM | POA: Diagnosis not present

## 2022-09-03 DIAGNOSIS — M47816 Spondylosis without myelopathy or radiculopathy, lumbar region: Secondary | ICD-10-CM | POA: Diagnosis not present

## 2022-09-03 DIAGNOSIS — M4724 Other spondylosis with radiculopathy, thoracic region: Secondary | ICD-10-CM | POA: Diagnosis not present

## 2022-09-03 DIAGNOSIS — M9904 Segmental and somatic dysfunction of sacral region: Secondary | ICD-10-CM | POA: Diagnosis not present

## 2022-09-03 DIAGNOSIS — M9901 Segmental and somatic dysfunction of cervical region: Secondary | ICD-10-CM | POA: Diagnosis not present

## 2022-09-03 DIAGNOSIS — M47814 Spondylosis without myelopathy or radiculopathy, thoracic region: Secondary | ICD-10-CM | POA: Diagnosis not present

## 2022-09-03 DIAGNOSIS — M9902 Segmental and somatic dysfunction of thoracic region: Secondary | ICD-10-CM | POA: Diagnosis not present

## 2022-09-03 DIAGNOSIS — M4723 Other spondylosis with radiculopathy, cervicothoracic region: Secondary | ICD-10-CM | POA: Diagnosis not present

## 2022-09-03 DIAGNOSIS — M9903 Segmental and somatic dysfunction of lumbar region: Secondary | ICD-10-CM | POA: Diagnosis not present

## 2022-09-03 DIAGNOSIS — M5116 Intervertebral disc disorders with radiculopathy, lumbar region: Secondary | ICD-10-CM | POA: Diagnosis not present

## 2022-09-03 DIAGNOSIS — M47813 Spondylosis without myelopathy or radiculopathy, cervicothoracic region: Secondary | ICD-10-CM | POA: Diagnosis not present

## 2022-09-03 DIAGNOSIS — S336XXA Sprain of sacroiliac joint, initial encounter: Secondary | ICD-10-CM | POA: Diagnosis not present

## 2022-10-06 DIAGNOSIS — M9904 Segmental and somatic dysfunction of sacral region: Secondary | ICD-10-CM | POA: Diagnosis not present

## 2022-10-06 DIAGNOSIS — M9903 Segmental and somatic dysfunction of lumbar region: Secondary | ICD-10-CM | POA: Diagnosis not present

## 2022-10-06 DIAGNOSIS — M4723 Other spondylosis with radiculopathy, cervicothoracic region: Secondary | ICD-10-CM | POA: Diagnosis not present

## 2022-10-06 DIAGNOSIS — M47816 Spondylosis without myelopathy or radiculopathy, lumbar region: Secondary | ICD-10-CM | POA: Diagnosis not present

## 2022-10-06 DIAGNOSIS — M47813 Spondylosis without myelopathy or radiculopathy, cervicothoracic region: Secondary | ICD-10-CM | POA: Diagnosis not present

## 2022-10-06 DIAGNOSIS — M9902 Segmental and somatic dysfunction of thoracic region: Secondary | ICD-10-CM | POA: Diagnosis not present

## 2022-10-06 DIAGNOSIS — M4724 Other spondylosis with radiculopathy, thoracic region: Secondary | ICD-10-CM | POA: Diagnosis not present

## 2022-10-06 DIAGNOSIS — M9901 Segmental and somatic dysfunction of cervical region: Secondary | ICD-10-CM | POA: Diagnosis not present

## 2022-10-06 DIAGNOSIS — M47814 Spondylosis without myelopathy or radiculopathy, thoracic region: Secondary | ICD-10-CM | POA: Diagnosis not present

## 2022-10-06 DIAGNOSIS — S336XXA Sprain of sacroiliac joint, initial encounter: Secondary | ICD-10-CM | POA: Diagnosis not present

## 2022-10-06 DIAGNOSIS — M5116 Intervertebral disc disorders with radiculopathy, lumbar region: Secondary | ICD-10-CM | POA: Diagnosis not present

## 2022-11-03 DIAGNOSIS — M9902 Segmental and somatic dysfunction of thoracic region: Secondary | ICD-10-CM | POA: Diagnosis not present

## 2022-11-03 DIAGNOSIS — M47813 Spondylosis without myelopathy or radiculopathy, cervicothoracic region: Secondary | ICD-10-CM | POA: Diagnosis not present

## 2022-11-03 DIAGNOSIS — M9901 Segmental and somatic dysfunction of cervical region: Secondary | ICD-10-CM | POA: Diagnosis not present

## 2022-11-03 DIAGNOSIS — M5116 Intervertebral disc disorders with radiculopathy, lumbar region: Secondary | ICD-10-CM | POA: Diagnosis not present

## 2022-11-03 DIAGNOSIS — M47816 Spondylosis without myelopathy or radiculopathy, lumbar region: Secondary | ICD-10-CM | POA: Diagnosis not present

## 2022-11-03 DIAGNOSIS — M9904 Segmental and somatic dysfunction of sacral region: Secondary | ICD-10-CM | POA: Diagnosis not present

## 2022-11-03 DIAGNOSIS — M4724 Other spondylosis with radiculopathy, thoracic region: Secondary | ICD-10-CM | POA: Diagnosis not present

## 2022-11-03 DIAGNOSIS — M4723 Other spondylosis with radiculopathy, cervicothoracic region: Secondary | ICD-10-CM | POA: Diagnosis not present

## 2022-11-03 DIAGNOSIS — M9903 Segmental and somatic dysfunction of lumbar region: Secondary | ICD-10-CM | POA: Diagnosis not present

## 2022-11-03 DIAGNOSIS — M47814 Spondylosis without myelopathy or radiculopathy, thoracic region: Secondary | ICD-10-CM | POA: Diagnosis not present

## 2022-12-08 DIAGNOSIS — M47816 Spondylosis without myelopathy or radiculopathy, lumbar region: Secondary | ICD-10-CM | POA: Diagnosis not present

## 2022-12-08 DIAGNOSIS — M47813 Spondylosis without myelopathy or radiculopathy, cervicothoracic region: Secondary | ICD-10-CM | POA: Diagnosis not present

## 2022-12-08 DIAGNOSIS — M9903 Segmental and somatic dysfunction of lumbar region: Secondary | ICD-10-CM | POA: Diagnosis not present

## 2022-12-08 DIAGNOSIS — M47814 Spondylosis without myelopathy or radiculopathy, thoracic region: Secondary | ICD-10-CM | POA: Diagnosis not present

## 2022-12-08 DIAGNOSIS — M9904 Segmental and somatic dysfunction of sacral region: Secondary | ICD-10-CM | POA: Diagnosis not present

## 2022-12-08 DIAGNOSIS — M9902 Segmental and somatic dysfunction of thoracic region: Secondary | ICD-10-CM | POA: Diagnosis not present

## 2022-12-08 DIAGNOSIS — M4723 Other spondylosis with radiculopathy, cervicothoracic region: Secondary | ICD-10-CM | POA: Diagnosis not present

## 2022-12-08 DIAGNOSIS — M4724 Other spondylosis with radiculopathy, thoracic region: Secondary | ICD-10-CM | POA: Diagnosis not present

## 2022-12-08 DIAGNOSIS — M5116 Intervertebral disc disorders with radiculopathy, lumbar region: Secondary | ICD-10-CM | POA: Diagnosis not present

## 2022-12-08 DIAGNOSIS — M9901 Segmental and somatic dysfunction of cervical region: Secondary | ICD-10-CM | POA: Diagnosis not present

## 2022-12-15 DIAGNOSIS — M5116 Intervertebral disc disorders with radiculopathy, lumbar region: Secondary | ICD-10-CM | POA: Diagnosis not present

## 2022-12-15 DIAGNOSIS — M9901 Segmental and somatic dysfunction of cervical region: Secondary | ICD-10-CM | POA: Diagnosis not present

## 2022-12-15 DIAGNOSIS — M47814 Spondylosis without myelopathy or radiculopathy, thoracic region: Secondary | ICD-10-CM | POA: Diagnosis not present

## 2022-12-15 DIAGNOSIS — M4724 Other spondylosis with radiculopathy, thoracic region: Secondary | ICD-10-CM | POA: Diagnosis not present

## 2022-12-15 DIAGNOSIS — M9903 Segmental and somatic dysfunction of lumbar region: Secondary | ICD-10-CM | POA: Diagnosis not present

## 2022-12-15 DIAGNOSIS — M47816 Spondylosis without myelopathy or radiculopathy, lumbar region: Secondary | ICD-10-CM | POA: Diagnosis not present

## 2022-12-15 DIAGNOSIS — M47813 Spondylosis without myelopathy or radiculopathy, cervicothoracic region: Secondary | ICD-10-CM | POA: Diagnosis not present

## 2022-12-15 DIAGNOSIS — M4723 Other spondylosis with radiculopathy, cervicothoracic region: Secondary | ICD-10-CM | POA: Diagnosis not present

## 2022-12-15 DIAGNOSIS — M9902 Segmental and somatic dysfunction of thoracic region: Secondary | ICD-10-CM | POA: Diagnosis not present

## 2022-12-15 DIAGNOSIS — M9904 Segmental and somatic dysfunction of sacral region: Secondary | ICD-10-CM | POA: Diagnosis not present

## 2023-01-28 ENCOUNTER — Ambulatory Visit: Payer: Medicare HMO

## 2023-01-28 DIAGNOSIS — Z0000 Encounter for general adult medical examination without abnormal findings: Secondary | ICD-10-CM

## 2023-01-28 NOTE — Progress Notes (Signed)
Subjective:   Katherine Villanueva is a 72 y.o. female who presents for Medicare Annual (Subsequent) preventive examination.  Visit Complete: Virtual I connected with  Holli Humbles on 01/28/23 by a audio enabled telemedicine application and verified that I am speaking with the correct person using two identifiers.  Patient Location: Home  Provider Location: Office/Clinic  I discussed the limitations of evaluation and management by telemedicine. The patient expressed understanding and agreed to proceed.  Vital Signs: Because this visit was a virtual/telehealth visit, some criteria may be missing or patient reported. Any vitals not documented were not able to be obtained and vitals that have been documented are patient reported.  Patient Medicare AWV questionnaire was completed by the patient on 01/28/23; I have confirmed that all information answered by patient is correct and no changes since this date.  Cardiac Risk Factors include: advanced age (>20men, >43 women)     Objective:    There were no vitals filed for this visit. There is no height or weight on file to calculate BMI.     01/28/2023    2:54 PM 01/08/2022    2:40 PM 12/20/2020    1:20 PM 04/11/2020    4:19 PM  Advanced Directives  Does Patient Have a Medical Advance Directive? Yes Yes Yes No  Type of Estate agent of [place];Living will Healthcare Power of [place];Living will Healthcare Power of [place];Living will   Does patient want to make changes to medical advance directive? No - Patient declined     Copy of Healthcare Power of Attorney in Chart? No - copy requested No - copy requested No - copy requested     Current Medications (verified) Outpatient Encounter Medications as of 01/28/2023  Medication Sig   acetaminophen (TYLENOL) 650 MG CR tablet Take 650 mg by mouth every 8 (eight) hours as needed for pain. (Patient not taking: Reported on 01/08/2022)   ALFALFA PO Take 3 tablets by mouth  2 (two) times daily.   Ascorbic Acid (VITAMIN C PO) Take 2 tablets by mouth 2 (two) times daily.   BIOTIN PO Take 1 tablet by mouth daily.   Cyanocobalamin (B-12 PO) Take 1 tablet by mouth daily.   HORSE CHESTNUT PO Take 1 tablet by mouth 2 (two) times daily.   Levomefolate Glucosamine (METHYLFOLATE PO) Take 1 tablet by mouth daily.   meclizine (ANTIVERT) 25 MG tablet Take 1 tablet (25 mg total) by mouth 3 (three) times daily as needed for dizziness. (Patient not taking: Reported on 01/08/2022)   Multiple Minerals-Vitamins (CALCIUM CITRATE PLUS/MAGNESIUM PO) Take by mouth.   OVER THE COUNTER MEDICATION Take 3 drops by mouth daily. Iodine Detoxadine   Pyridoxine HCl (B-6 PO) Take 1 tablet by mouth daily.   TURMERIC CURCUMIN PO Take 2 tablets by mouth daily.   VITAMIN D-VITAMIN K PO Take 1 tablet by mouth daily.   No facility-administered encounter medications on file as of 01/28/2023.    Allergies (verified) Patient has no known allergies.   History: Past Medical History:  Diagnosis Date   Colon cancer screening 05/2018   Cologuard neg 05/2018; repeat 2023.   GERD (gastroesophageal reflux disease)    History of kidney stones    Hypercholesterolemia 02/2017   LDL > 200. Pt chose TLC and LDL decreased to 130 in 2 months.  Rpt 05/2018: LDL 170s, HDL 80->Fram rx = 4.5%->continue TLC. Stable (10 yr frmhm cv risk 6.5%) 05/2020 on TLC.   Left inguinal hernia 11/2016   Watchful waiting  approach   Osteoporosis 11/04/2018   T score -3.2; recommended fosamax 10/2018. T score 09/2021 -3.3   Prediabetes 05/2019   2021 A1c 6.1%.  Feb 2022 a1c 5.8%.   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   Past Surgical History:  Procedure Laterality Date   COLONOSCOPY     X 2.  Most recent ? 20014?  No abnormals.   DEXA  11/04/2018   T score -3.2 spine   LAPAROSCOPY  1981   "for possible  infertility"   TONSILLECTOMY  1960   Family History  Problem Relation Age of Onset   Arthritis Mother    Stroke Mother    Kidney disease Mother    Hyperlipidemia Father    Heart disease Father    Diabetes Father    Stroke Brother    Galactosemia Son    Lymphoma Paternal Grandmother    Diabetes Paternal Grandfather    Social History   Socioeconomic History   Marital status: Widowed    Spouse name: Not on file   Number of children: Not on file   Years of education: Not on file   Highest education level: Not on file  Occupational History   Not on file  Tobacco Use   Smoking status: Never   Smokeless tobacco: Never  Vaping Use   Vaping status: Never Used  Substance and Sexual Activity   Alcohol use: Yes    Alcohol/week: 3.0 standard drinks of alcohol    Types: 3 Glasses of wine per week   Drug use: No   Sexual activity: Not Currently  Other Topics Concern   Not on file  Social History Narrative   Widow (husba d cancer), 1 daughter and one son.   Educ: nursing school.   Occup: retired--hx of LPN work, sign Engineer, technical sales.  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   Moved from [place] 06/2016.   Alc: 4 glasses red wine per week.   No tobacco or drugs.   Social Determinants of Health   Financial Resource Strain: Low Risk  (01/28/2023)   Overall Financial Resource Strain (CARDIA)    Difficulty of Paying Living Expenses: Not hard at all  Food Insecurity: No Food Insecurity (01/28/2023)   Hunger Vital Sign    Worried About Running Out of Food in the Last Year: Never true    Ran Out of Food in the Last Year: Never true  Transportation Needs: No Transportation Needs (01/28/2023)   PRAPARE - Administrator, Civil Service (Medical): No    Lack of Transportation (Non-Medical): No  Physical Activity: Insufficiently Active (01/28/2023)   Exercise Vital Sign    Days of Exercise per Week: 3 days    Minutes of Exercise per Session: 20 min  Stress: No Stress Concern Present  (01/28/2023)   [employer] of Occupational Health - Occupational Stress Questionnaire    Feeling of Stress : Only a little  Social Connections: Unknown (01/28/2023)   Social Connection and Isolation Panel [NHANES]    Frequency of Communication with Friends and Family: More than three times a week    Frequency of Social Gatherings with Friends and Family: More than three times a week    Attends Religious Services: Not on file    Active Member of Clubs or Organizations: Yes    Attends Banker Meetings: 1 to  4 times per year    Marital Status: Widowed    Tobacco Counseling Counseling given: Not Answered   Clinical Intake:  Pre-visit preparation completed: No  Pain : No/denies pain     Nutritional Risks: None Diabetes: No  How often do you need to have someone help you when you read instructions, pamphlets, or other written materials from your doctor or pharmacy?: 1 - Never  Interpreter Needed?: No      Activities of Daily Living    01/28/2023    2:54 PM 01/27/2023    7:13 PM  In your present state of health, do you have any difficulty performing the following activities:  Hearing? 0 0  Vision? 0 0  Difficulty concentrating or making decisions? 0 0  Walking or climbing stairs? 0 0  Dressing or bathing? 0 0  Doing errands, shopping? 0 0  Preparing Food and eating ? N N  Using the Toilet? N N  In the past six months, have you accidently leaked urine? N N  Do you have problems with loss of bowel control? N N  Managing your Medications? N N  Managing your Finances? N N  Housekeeping or managing your Housekeeping? N N    Patient Care Team: Jeoffrey Massed, MD as PCP - General (Family Medicine) Serena Colonel, MD as Consulting Physician (Otolaryngology)  Indicate any recent Medical Services you may have received from other than [facility] providers in the past year (date may be approximate).     Assessment:   This is a routine wellness examination for  Katherine Villanueva.  Hearing/Vision screen No results found.   Goals Addressed   None    Depression Screen    01/28/2023    2:56 PM 06/16/2022    7:58 AM 01/08/2022    2:39 PM 12/20/2020    1:24 PM 06/13/2020    8:22 AM 06/13/2019    8:20 AM 06/07/2018    9:37 AM  PHQ 2/9 Scores  PHQ - 2 Score 0 0 0 0 0 0 0    Fall Risk    01/28/2023    3:11 PM 01/27/2023    7:13 PM 06/16/2022    7:58 AM 01/08/2022    2:41 PM 12/20/2020    1:18 PM  Fall Risk   Falls in the past year? 0 0 0 0 0  Number falls in past yr: 0 0 0 0 0  Injury with Fall? 0 0 0 0 0  Risk for fall due to : No Fall Risks  No Fall Risks No Fall Risks;Impaired vision   Follow up Falls evaluation completed  Falls evaluation completed Falls prevention discussed Falls evaluation completed;Falls prevention discussed    MEDICARE RISK AT HOME: Medicare Risk at Home Any stairs in or around the home?: Yes If so, are there any without handrails?: Yes Home free of loose throw rugs in walkways, pet beds, electrical cords, etc?: Yes Adequate lighting in your home to reduce risk of falls?: Yes Life alert?: No Use of a cane, walker or w/c?: No Grab bars in the bathroom?: Yes Shower chair or bench in shower?: Yes Elevated toilet seat or a handicapped toilet?: No  TIMED UP AND GO:  Was the test performed?  No    Cognitive Function:        01/28/2023    2:52 PM 01/08/2022    2:42 PM  6CIT Screen  What Year? 0 points 0 points  What month? 0 points 0 points  What time? 0  points 0 points  Count back from 20 0 points 0 points  Months in reverse 0 points 0 points  Repeat phrase 0 points 0 points  Total Score 0 points 0 points    Immunizations  There is no immunization history on file for this patient.  TDAP status: Due, Education has been provided regarding the importance of this vaccine. Advised may receive this vaccine at local pharmacy or Health Dept. Aware to provide a copy of the vaccination record if obtained from local  pharmacy or Health Dept. Verbalized acceptance and understanding.  Flu Vaccine status: Declined, Education has been provided regarding the importance of this vaccine but patient still declined. Advised may receive this vaccine at local pharmacy or Health Dept. Aware to provide a copy of the vaccination record if obtained from local pharmacy or Health Dept. Verbalized acceptance and understanding.  Pneumococcal vaccine status: Declined,  Education has been provided regarding the importance of this vaccine but patient still declined. Advised may receive this vaccine at local pharmacy or Health Dept. Aware to provide a copy of the vaccination record if obtained from local pharmacy or Health Dept. Verbalized acceptance and understanding.   Covid-19 vaccine status: Declined, Education has been provided regarding the importance of this vaccine but patient still declined. Advised may receive this vaccine at local pharmacy or Health Dept.or vaccine clinic. Aware to provide a copy of the vaccination record if obtained from local pharmacy or Health Dept. Verbalized acceptance and understanding.  Qualifies for Shingles Vaccine? Yes   Zostavax completed No   Shingrix Completed?: No.    Education has been provided regarding the importance of this vaccine. Patient has been advised to call insurance company to determine out of pocket expense if they have not yet received this vaccine. Advised may also receive vaccine at local pharmacy or Health Dept. Verbalized acceptance and understanding.  Screening Tests Health Maintenance  Topic Date Due   COVID-19 Vaccine (1 - 2023-24 season) 02/13/2023 (Originally 12/21/2022)   Zoster Vaccines- Shingrix (1 of 2) 04/30/2023 (Originally 05/18/2002)   Pneumonia Vaccine 63+ Years old (1 of 1 - PCV) 06/17/2023 (Originally 05/18/2017)   INFLUENZA VACCINE  07/20/2023 (Originally 11/20/2022)   MAMMOGRAM  10/03/2023   Medicare Annual Wellness (AWV)  01/28/2024   Fecal DNA (Cologuard)   07/01/2024   DEXA SCAN  Completed   HPV VACCINES  Aged Out   DTaP/Tdap/Td  Discontinued   Hepatitis C Screening  Discontinued    Health Maintenance  There are no preventive care reminders to display for this patient.   Colorectal cancer screening: Type of screening: Cologuard. Completed 07/01/21. Repeat every 3 years  Mammogram status: Completed 10/02/21. Repeat every year  Bone Density status: Completed 10/03/21. Results reflect: Bone density results: OSTEOPOROSIS. Repeat every 2 years.  Lung Cancer Screening: (Low Dose CT Chest recommended if Age 68-80 years, 20 pack-year currently smoking OR have quit w/in 15years.) does not qualify.   Lung Cancer Screening Referral: n/a  Additional Screening:  Hepatitis C Screening: does qualify; Completed n/a  Vision Screening: Recommended annual ophthalmology exams for early detection of glaucoma and other disorders of the eye. Is the patient up to date with their annual eye exam?  Yes  Who is the provider or what is the name of the office in which the patient attends annual eye exams? [facility] If pt is not established with a provider, would they like to be referred to a provider to establish care? No .   Dental Screening: Recommended annual  dental exams for proper oral hygiene  Diabetic Foot Exam: n/a  Community Resource Referral / Chronic Care Management: CRR required this visit?  No   CCM required this visit?  No     Plan:     I have personally reviewed and noted the following in the patient's chart:   Medical and social history Use of alcohol, tobacco or illicit drugs  Current medications and supplements including opioid prescriptions. Patient is not currently taking opioid prescriptions. Functional ability and status Nutritional status Physical activity Advanced directives List of other physicians Hospitalizations, surgeries, and ER visits in previous 12 months Vitals Screenings to include cognitive,  depression, and falls Referrals and appointments  In addition, I have reviewed and discussed with patient certain preventive protocols, quality metrics, and best practice recommendations. A written personalized care plan for preventive services as well as general preventive health recommendations were provided to patient.     Filomena Jungling, CMA   01/28/2023   After Visit Summary: (Declined) Due to this being a telephonic visit, with patients personalized plan was offered to patient but patient Declined AVS at this time   Nurse Notes: Non-Face to Face or Face to Face 6 minute visit Encounter   Ms. Charlsie Quest , Thank you for taking time to come for your Medicare Wellness Visit. I appreciate your ongoing commitment to your health goals. Please review the following plan we discussed and let me know if I can assist you in the future.   These are the goals we discussed:  Goals      Patient Stated     Increase exercise      Patient Stated     Gain a little weight         This is a list of the screening recommended for you and due dates:  Health Maintenance  Topic Date Due   COVID-19 Vaccine (1 - 2023-24 season) 02/13/2023*   Zoster (Shingles) Vaccine (1 of 2) 04/30/2023*   Pneumonia Vaccine (1 of 1 - PCV) 06/17/2023*   Flu Shot  07/20/2023*   Mammogram  10/03/2023   Medicare Annual Wellness Visit  01/28/2024   Cologuard (Stool DNA test)  07/01/2024   DEXA scan (bone density measurement)  Completed   HPV Vaccine  Aged Out   DTaP/Tdap/Td vaccine  Discontinued   Hepatitis C Screening  Discontinued  *Topic was postponed. The date shown is not the original due date.

## 2023-01-28 NOTE — Patient Instructions (Signed)

## 2023-06-16 NOTE — Patient Instructions (Signed)

## 2023-06-18 ENCOUNTER — Encounter: Payer: Self-pay | Admitting: Family Medicine

## 2023-06-18 ENCOUNTER — Ambulatory Visit: Payer: Medicare HMO | Admitting: Family Medicine

## 2023-06-18 VITALS — BP 127/74 | HR 62 | Ht 65.75 in | Wt 105.2 lb

## 2023-06-18 DIAGNOSIS — Z0000 Encounter for general adult medical examination without abnormal findings: Secondary | ICD-10-CM | POA: Diagnosis not present

## 2023-06-18 DIAGNOSIS — E7800 Pure hypercholesterolemia, unspecified: Secondary | ICD-10-CM

## 2023-06-18 DIAGNOSIS — M810 Age-related osteoporosis without current pathological fracture: Secondary | ICD-10-CM

## 2023-06-18 DIAGNOSIS — R7303 Prediabetes: Secondary | ICD-10-CM

## 2023-06-18 NOTE — Progress Notes (Signed)
 Office Note 06/18/2023  CC:  Chief Complaint  Patient presents with   Annual Exam    HPI:  Patient is a 73 y.o. female who is here for annual health maintenance exam and follow-up prediabetes and hypercholesterolemia.  Doing great.  She takes care of her 2 young grandchildren 5 days a week and loves it.  She is very active and eats a very healthy, low-carb diet.  She checks glucose occasionally at home and it is 90-110 range fasting.  Past Medical History:  Diagnosis Date   Colon cancer screening 05/2018   Cologuard neg 05/2018 and 2023. Rpt 2026   GERD (gastroesophageal reflux disease)    History of kidney stones    Hypercholesterolemia 02/2017   LDL > 200. Pt chose TLC and LDL decreased to 130 in 2 months.  Rpt 05/2018: LDL 170s, HDL 80->Fram rx = 4.5%->continue TLC. Stable (10 yr frmhm cv risk 6.5%) 05/2020 on TLC.   Left inguinal hernia 11/2016   Watchful waiting approach   Osteoporosis 11/04/2018   T score -3.2; recommended fosamax 10/2018. T score 09/2021 -3.3   Prediabetes 05/2019   2021 A1c 6.1%.  Feb 2022 a1c 5.8%.   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    Past Surgical History:  Procedure Laterality Date   COLONOSCOPY     X 2.  Most recent ? 20014?  No abnormals.   DEXA  11/04/2018   T score -3.2 spine   LAPAROSCOPY  1981   "for possible infertility"   TONSILLECTOMY  1960    Family History  Problem Relation Age of Onset   Arthritis Mother    Stroke Mother    Kidney disease Mother    Hyperlipidemia Father    Heart disease Father    Diabetes Father    Stroke Brother    Galactosemia Son    Lymphoma Paternal Grandmother    Diabetes Paternal Grandfather     Social History   Socioeconomic History   Marital status: Widowed    Spouse name: Not on file   Number of children: Not on file   Years of education: Not on file   Highest  education level: Not on file  Occupational History   Not on file  Tobacco Use   Smoking status: Never   Smokeless tobacco: Never  Vaping Use   Vaping status: Never Used  Substance and Sexual Activity   Alcohol use: Yes    Alcohol/week: 3.0 standard drinks of alcohol    Types: 3 Glasses of wine per week   Drug use: No   Sexual activity: Not Currently  Other Topics Concern   Not on file  Social History Narrative   Widow (husba d cancer), 1 daughter and one son.   Educ: nursing school.   Occup: retired--hx of LPN work, sign Engineer, technical sales.  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   Moved from [place] 06/2016.   Alc: 4 glasses red wine per week.   No tobacco or drugs.   Social Drivers of Corporate investment banker Strain: Low Risk  (01/28/2023)   Overall Financial Resource Strain (CARDIA)    Difficulty of Paying Living Expenses: Not hard at all  Food Insecurity: No Food Insecurity (01/28/2023)   Hunger Vital Sign    Worried About Running Out of Food in the Last Year: Never true  Ran Out of Food in the Last Year: Never true  Transportation Needs: No Transportation Needs (01/28/2023)   PRAPARE - Administrator, Civil Service (Medical): No    Lack of Transportation (Non-Medical): No  Physical Activity: Insufficiently Active (01/28/2023)   Exercise Vital Sign    Days of Exercise per Week: 3 days    Minutes of Exercise per Session: 20 min  Stress: No Stress Concern Present (01/28/2023)   [employer] of Occupational Health - Occupational Stress Questionnaire    Feeling of Stress : Only a little  Social Connections: Unknown (01/28/2023)   Social Connection and Isolation Panel [NHANES]    Frequency of Communication with Friends and Family: More than three times a week    Frequency of Social Gatherings with Friends and Family: More than three times a week    Attends Religious Services: Not on file    Active Member of Clubs or Organizations: Yes    Attends Banker  Meetings: 1 to 4 times per year    Marital Status: Widowed  Intimate Partner Violence: Not At Risk (01/28/2023)   Humiliation, Afraid, Rape, and Kick questionnaire    Fear of Current or Ex-Partner: No    Emotionally Abused: No    Physically Abused: No    Sexually Abused: No    Outpatient Medications Prior to Visit  Medication Sig Dispense Refill   ALFALFA PO Take 3 tablets by mouth 2 (two) times daily.     Ascorbic Acid (VITAMIN C PO) Take 2 tablets by mouth 2 (two) times daily.     BIOTIN PO Take 1 tablet by mouth daily.     Cyanocobalamin (B-12 PO) Take 1 tablet by mouth daily.     HORSE CHESTNUT PO Take 1 tablet by mouth 2 (two) times daily.     Levomefolate Glucosamine (METHYLFOLATE PO) Take 1 tablet by mouth daily.     Multiple Minerals-Vitamins (CALCIUM CITRATE PLUS/MAGNESIUM PO) Take by mouth.     OVER THE COUNTER MEDICATION Take 3 drops by mouth daily. Iodine Detoxadine     Pyridoxine HCl (B-6 PO) Take 1 tablet by mouth daily.     TURMERIC CURCUMIN PO Take 2 tablets by mouth daily.     VITAMIN D-VITAMIN K PO Take 1 tablet by mouth daily.     acetaminophen (TYLENOL) 650 MG CR tablet Take 650 mg by mouth every 8 (eight) hours as needed for pain. (Patient not taking: Reported on 06/18/2023)     meclizine (ANTIVERT) 25 MG tablet Take 1 tablet (25 mg total) by mouth 3 (three) times daily as needed for dizziness. (Patient not taking: Reported on 06/18/2023) 30 tablet 0   No facility-administered medications prior to visit.    No Known Allergies  Review of Systems  Constitutional:  Negative for appetite change, chills, fatigue and fever.  HENT:  Negative for congestion, dental problem, ear pain and sore throat.   Eyes:  Negative for discharge, redness and visual disturbance.  Respiratory:  Negative for cough, chest tightness, shortness of breath and wheezing.   Cardiovascular:  Negative for chest pain, palpitations and leg swelling.  Gastrointestinal:  Negative for abdominal pain,  blood in stool, diarrhea, nausea and vomiting.  Genitourinary:  Negative for difficulty urinating, dysuria, flank pain, frequency, hematuria and urgency.  Musculoskeletal:  Negative for arthralgias, back pain, joint swelling, myalgias and neck stiffness.  Skin:  Negative for pallor and rash.  Neurological:  Negative for dizziness, speech difficulty, weakness and headaches.  Hematological:  Negative for adenopathy. Does not bruise/bleed easily.  Psychiatric/Behavioral:  Negative for confusion and sleep disturbance. The patient is not nervous/anxious.     PE;    06/18/2023    7:57 AM 06/16/2022    7:54 AM 06/13/2021    8:08 AM  Vitals with BMI  Height 5' 5.75" 5' 5.75" 5' 4.961"  Weight 105 lbs 3 oz 108 lbs 3 oz 115 lbs 13 oz  BMI 17.11 17.6 19.29  Systolic 127 108 90  Diastolic 74 60 57  Pulse 62 62 73   Gen: Alert, well appearing.  Patient is oriented to person, place, time, and situation. AFFECT: pleasant, lucid thought and speech. ENT: Ears: EACs clear, normal epithelium.  TMs with good light reflex and landmarks bilaterally.  Eyes: no injection, icteris, swelling, or exudate.  EOMI, PERRLA. Nose: no drainage or turbinate edema/swelling.  No injection or focal lesion.  Mouth: lips without lesion/swelling.  Oral mucosa pink and moist.  Dentition intact and without obvious caries or gingival swelling.  Oropharynx without erythema, exudate, or swelling.  Neck: supple/nontender.  No LAD, mass, or TM.  Carotid pulses 2+ bilaterally, without bruits. CV: RRR, no m/r/g.   LUNGS: CTA bilat, nonlabored resps, good aeration in all lung fields. ABD: soft, NT, ND, BS normal.  No hepatospenomegaly or mass.  No bruits. EXT: no clubbing, cyanosis, or edema.  Musculoskeletal: no joint swelling, erythema, warmth, or tenderness.  ROM of all joints intact. Skin - no sores or suspicious lesions or rashes or color changes  Pertinent labs:  Lab Results  Component Value Date   TSH 3.84 06/16/2022    Lab Results  Component Value Date   WBC 5.3 06/16/2022   HGB 15.0 06/16/2022   HCT 45.0 06/16/2022   MCV 95.3 06/16/2022   PLT 245 06/16/2022   Lab Results  Component Value Date   CREATININE 0.68 06/16/2022   BUN 23 06/16/2022   NA 141 06/16/2022   K 4.2 06/16/2022   CL 104 06/16/2022   CO2 30 06/16/2022   Lab Results  Component Value Date   ALT 20 06/16/2022   AST 21 06/16/2022   ALKPHOS 53 06/13/2021   BILITOT 0.9 06/16/2022   Lab Results  Component Value Date   CHOL 327 (H) 06/16/2022   Lab Results  Component Value Date   HDL 80 06/16/2022   Lab Results  Component Value Date   LDLCALC 231 (H) 06/16/2022   Lab Results  Component Value Date   TRIG 57 06/16/2022   Lab Results  Component Value Date   CHOLHDL 4.1 06/16/2022   Lab Results  Component Value Date   HGBA1C 6.0 (H) 06/16/2022   ASSESSMENT AND PLAN:   #1 health maintenance exam: Reviewed age and gender appropriate health maintenance issues (prudent diet, regular exercise, health risks of tobacco and excessive alcohol, use of seatbelts, fire alarms in home, use of sunscreen).  Also reviewed age and gender appropriate health screening as well as vaccine recommendations. Vaccines: Prevnar, shingrix, flu--> Pt declines. Labs: cbc, tsh, CMET, FLP, Hba1c today (fasting). Cervical ca screening: no further screening indicated. Breast ca screening: mammogram NEG 10/02/2021.  Repeat due as of June 2024: she declines. Colon ca screening: Cologuard NEG 06/2021.  Repeat 06/2024. Osteoporosis: DEXA in June 2023 showed T-score -3.3.  She has declined medication treatment for this-->she had a friend who took med for this and got a femur fracture. Cont ca++ and vit D and good wt bearing exercise habits.  #2 prediabetes.  She has great diet and exercise habits. Hemoglobin A1c and fasting glucose today.  3.  Hypercholesterolemia. LDL was 231 at her CPE 1 year ago. She declines medications. Lipid panel  today.  An After Visit Summary was printed and given to the patient.  FOLLOW UP:  No follow-ups on file.  Signed:  Santiago Bumpers, MD           06/18/2023

## 2023-06-19 ENCOUNTER — Encounter: Payer: Self-pay | Admitting: Family Medicine

## 2023-06-19 LAB — COMPREHENSIVE METABOLIC PANEL
AG Ratio: 2 (calc) (ref 1.0–2.5)
ALT: 26 U/L (ref 6–29)
AST: 22 U/L (ref 10–35)
Albumin: 4.5 g/dL (ref 3.6–5.1)
Alkaline phosphatase (APISO): 62 U/L (ref 37–153)
BUN: 25 mg/dL (ref 7–25)
CO2: 27 mmol/L (ref 20–32)
Calcium: 9.7 mg/dL (ref 8.6–10.4)
Chloride: 105 mmol/L (ref 98–110)
Creat: 0.7 mg/dL (ref 0.60–1.00)
Globulin: 2.3 g/dL (ref 1.9–3.7)
Glucose, Bld: 102 mg/dL — ABNORMAL HIGH (ref 65–99)
Potassium: 4.4 mmol/L (ref 3.5–5.3)
Sodium: 140 mmol/L (ref 135–146)
Total Bilirubin: 0.8 mg/dL (ref 0.2–1.2)
Total Protein: 6.8 g/dL (ref 6.1–8.1)

## 2023-06-19 LAB — CBC WITH DIFFERENTIAL/PLATELET
Absolute Lymphocytes: 1830 {cells}/uL (ref 850–3900)
Absolute Monocytes: 392 {cells}/uL (ref 200–950)
Basophils Absolute: 31 {cells}/uL (ref 0–200)
Basophils Relative: 0.7 %
Eosinophils Absolute: 70 {cells}/uL (ref 15–500)
Eosinophils Relative: 1.6 %
HCT: 42.5 % (ref 35.0–45.0)
Hemoglobin: 14.1 g/dL (ref 11.7–15.5)
MCH: 32 pg (ref 27.0–33.0)
MCHC: 33.2 g/dL (ref 32.0–36.0)
MCV: 96.4 fL (ref 80.0–100.0)
MPV: 10.6 fL (ref 7.5–12.5)
Monocytes Relative: 8.9 %
Neutro Abs: 2077 {cells}/uL (ref 1500–7800)
Neutrophils Relative %: 47.2 %
Platelets: 235 10*3/uL (ref 140–400)
RBC: 4.41 10*6/uL (ref 3.80–5.10)
RDW: 11.9 % (ref 11.0–15.0)
Total Lymphocyte: 41.6 %
WBC: 4.4 10*3/uL (ref 3.8–10.8)

## 2023-06-19 LAB — LIPID PANEL
Cholesterol: 274 mg/dL — ABNORMAL HIGH (ref <200)
HDL: 89 mg/dL (ref >=50)
LDL Cholesterol (Calc): 172 mg/dL — ABNORMAL HIGH
Non-HDL Cholesterol (Calc): 185 mg/dL — ABNORMAL HIGH (ref <130)
Total CHOL/HDL Ratio: 3.1 (calc) (ref <5.0)
Triglycerides: 42 mg/dL (ref <150)

## 2023-06-19 LAB — HEMOGLOBIN A1C
Hgb A1c MFr Bld: 5.8 %{Hb} — ABNORMAL HIGH (ref <5.7)
Mean Plasma Glucose: 120 mg/dL
eAG (mmol/L): 6.6 mmol/L

## 2023-06-19 LAB — TSH: TSH: 3.02 m[IU]/L (ref 0.40–4.50)

## 2023-06-26 DIAGNOSIS — H5213 Myopia, bilateral: Secondary | ICD-10-CM | POA: Diagnosis not present

## 2024-02-10 ENCOUNTER — Ambulatory Visit (INDEPENDENT_AMBULATORY_CARE_PROVIDER_SITE_OTHER): Admitting: *Deleted

## 2024-02-10 VITALS — Ht 64.75 in | Wt 105.0 lb

## 2024-02-10 DIAGNOSIS — Z0000 Encounter for general adult medical examination without abnormal findings: Secondary | ICD-10-CM

## 2024-02-10 NOTE — Patient Instructions (Signed)
 [name] , Thank you for taking time to come for your Medicare Wellness Visit. I appreciate your ongoing commitment to your health goals. Please review the following plan we discussed and let me know if I can assist you in the future.   Screening recommendations/referrals: Colonoscopy: up to date Mammogram:  Bone Density:  Recommended yearly ophthalmology/optometry visit for glaucoma screening and checkup Recommended yearly dental visit for hygiene and checkup  Vaccinations: Influenza vaccine:  Pneumococcal vaccine:  Tdap vaccine:  Shingles vaccine:        Preventive Care 65 Years and Older, Female Preventive care refers to lifestyle choices and visits with your health care provider that can promote health and wellness. What does preventive care include? A yearly physical exam. This is also called an annual well check. Dental exams once or twice a year. Routine eye exams. Ask your health care provider how often you should have your eyes checked. Personal lifestyle choices, including: Daily care of your teeth and gums. Regular physical activity. Eating a healthy diet. Avoiding tobacco and drug use. Limiting alcohol use. Practicing safe sex. Taking low-dose aspirin every day. Taking vitamin and mineral supplements as recommended by your health care provider. What happens during an annual well check? The services and screenings done by your health care provider during your annual well check will depend on your age, overall health, lifestyle risk factors, and family history of disease. Counseling  Your health care provider may ask you questions about your: Alcohol use. Tobacco use. Drug use. Emotional well-being. Home and relationship well-being. Sexual activity. Eating habits. History of falls. Memory and ability to understand (cognition). Work and work Astronomer. Reproductive health. Screening  You may have the following tests or measurements: Height, weight, and  BMI. Blood pressure. Lipid and cholesterol levels. These may be checked every 5 years, or more frequently if you are over 54 years old. Skin check. Lung cancer screening. You may have this screening every year starting at age 71 if you have a 30-pack-year history of smoking and currently smoke or have quit within the past 15 years. Fecal occult blood test (FOBT) of the stool. You may have this test every year starting at age 71. Flexible sigmoidoscopy or colonoscopy. You may have a sigmoidoscopy every 5 years or a colonoscopy every 10 years starting at age 71. Hepatitis C blood test. Hepatitis B blood test. Sexually transmitted disease (STD) testing. Diabetes screening. This is done by checking your blood sugar (glucose) after you have not eaten for a while (fasting). You may have this done every 1-3 years. Bone density scan. This is done to screen for osteoporosis. You may have this done starting at age 71. Mammogram. This may be done every 1-2 years. Talk to your health care provider about how often you should have regular mammograms. Talk with your health care provider about your test results, treatment options, and if necessary, the need for more tests. Vaccines  Your health care provider may recommend certain vaccines, such as: Influenza vaccine. This is recommended every year. Tetanus, diphtheria, and acellular pertussis (Tdap, Td) vaccine. You may need a Td booster every 10 years. Zoster vaccine. You may need this after age 71. Pneumococcal 13-valent conjugate (PCV13) vaccine. One dose is recommended after age 65. Pneumococcal polysaccharide (PPSV23) vaccine. One dose is recommended after age 43. Talk to your health care provider about which screenings and vaccines you need and how often you need them. This information is not intended to replace advice given to you by  your health care provider. Make sure you discuss any questions you have with your health care provider. Document  Released: 05/04/2015 Document Revised: 12/26/2015 Document Reviewed: 02/06/2015 Elsevier Interactive Patient Education  2017 [employer].  Fall Prevention in the Home Falls can cause injuries. They can happen to people of all ages. There are many things you can do to make your home safe and to help prevent falls. What can I do on the outside of my home? Regularly fix the edges of walkways and driveways and fix any cracks. Remove anything that might make you trip as you walk through a door, such as a raised step or threshold. Trim any bushes or trees on the path to your home. Use bright outdoor lighting. Clear any walking paths of anything that might make someone trip, such as rocks or tools. Regularly check to see if handrails are loose or broken. Make sure that both sides of any steps have handrails. Any raised decks and porches should have guardrails on the edges. Have any leaves, snow, or ice cleared regularly. Use sand or salt on walking paths during winter. Clean up any spills in your garage right away. This includes oil or grease spills. What can I do in the bathroom? Use night lights. Install grab bars by the toilet and in the tub and shower. Do not use towel bars as grab bars. Use non-skid mats or decals in the tub or shower. If you need to sit down in the shower, use a plastic, non-slip stool. Keep the floor dry. Clean up any water that spills on the floor as soon as it happens. Remove soap buildup in the tub or shower regularly. Attach bath mats securely with double-sided non-slip rug tape. Do not have throw rugs and other things on the floor that can make you trip. What can I do in the bedroom? Use night lights. Make sure that you have a light by your bed that is easy to reach. Do not use any sheets or blankets that are too big for your bed. They should not hang down onto the floor. Have a firm chair that has side arms. You can use this for support while you get dressed. Do  not have throw rugs and other things on the floor that can make you trip. What can I do in the kitchen? Clean up any spills right away. Avoid walking on wet floors. Keep items that you use a lot in easy-to-reach places. If you need to reach something above you, use a strong step stool that has a grab bar. Keep electrical cords out of the way. Do not use floor polish or wax that makes floors slippery. If you must use wax, use non-skid floor wax. Do not have throw rugs and other things on the floor that can make you trip. What can I do with my stairs? Do not leave any items on the stairs. Make sure that there are handrails on both sides of the stairs and use them. Fix handrails that are broken or loose. Make sure that handrails are as long as the stairways. Check any carpeting to make sure that it is firmly attached to the stairs. Fix any carpet that is loose or worn. Avoid having throw rugs at the top or bottom of the stairs. If you do have throw rugs, attach them to the floor with carpet tape. Make sure that you have a light switch at the top of the stairs and the bottom of the stairs. If you  do not have them, ask someone to add them for you. What else can I do to help prevent falls? Wear shoes that: Do not have high heels. Have rubber bottoms. Are comfortable and fit you well. Are closed at the toe. Do not wear sandals. If you use a stepladder: Make sure that it is fully opened. Do not climb a closed stepladder. Make sure that both sides of the stepladder are locked into place. Ask someone to hold it for you, if possible. Clearly mark and make sure that you can see: Any grab bars or handrails. First and last steps. Where the edge of each step is. Use tools that help you move around (mobility aids) if they are needed. These include: Canes. Walkers. Scooters. Crutches. Turn on the lights when you go into a dark area. Replace any light bulbs as soon as they burn out. Set up your  furniture so you have a clear path. Avoid moving your furniture around. If any of your floors are uneven, fix them. If there are any pets around you, be aware of where they are. Review your medicines with your doctor. Some medicines can make you feel dizzy. This can increase your chance of falling. Ask your doctor what other things that you can do to help prevent falls. This information is not intended to replace advice given to you by your health care provider. Make sure you discuss any questions you have with your health care provider. Document Released: 02/01/2009 Document Revised: 09/13/2015 Document Reviewed: 05/12/2014 Elsevier Interactive Patient Education  2017 [employer].

## 2024-02-10 NOTE — Progress Notes (Signed)
 Subjective:   Katherine Villanueva is a 73 y.o. female who presents for Medicare Annual (Subsequent) preventive examination.  Visit Complete: Virtual I connected with  Katherine Villanueva on 02/10/24 by a audio enabled telemedicine application and verified that I am speaking with the correct person using two identifiers.  Patient Location: Home  Provider Location: Home Office  I discussed the limitations of evaluation and management by telemedicine. The patient expressed understanding and agreed to proceed.  Vital Signs: Because this visit was a virtual/telehealth visit, some criteria may be missing or patient reported. Any vitals not documented were not able to be obtained and vitals that have been documented are patient reported.   Cardiac Risk Factors include: advanced age (>41men, >61 women);obesity (BMI >30kg/m2);family history of premature cardiovascular disease     Objective:    Today's Vitals   02/10/24 1444  Weight: 105 lb (47.6 kg)  Height: 5' 4.75 (1.645 m)   Body mass index is 17.61 kg/m.     02/10/2024    2:43 PM 01/28/2023    2:54 PM 01/08/2022    2:40 PM 12/20/2020    1:20 PM 04/11/2020    4:19 PM  Advanced Directives  Does Patient Have a Medical Advance Directive? Yes Yes Yes Yes No  Type of Estate agent of [employer] Power of [place];Living will Healthcare Power of [place];Living will Healthcare Power of [place];Living will   Does patient want to make changes to medical advance directive?  No - Patient declined     Copy of Healthcare Power of Attorney in Chart?  No - copy requested No - copy requested No - copy requested   Would patient like information on creating a medical advance directive? No - Patient declined        Current Medications (verified) Outpatient Encounter Medications as of 02/10/2024  Medication Sig   ALFALFA PO Take 3 tablets by mouth 2 (two) times daily.   Ascorbic Acid (VITAMIN C PO) Take 2 tablets by  mouth 2 (two) times daily.   BIOTIN PO Take 1 tablet by mouth daily.   Cyanocobalamin (B-12 PO) Take 1 tablet by mouth daily.   HORSE CHESTNUT PO Take 1 tablet by mouth 2 (two) times daily.   Levomefolate Glucosamine (METHYLFOLATE PO) Take 1 tablet by mouth daily.   meclizine  (ANTIVERT ) 25 MG tablet Take 1 tablet (25 mg total) by mouth 3 (three) times daily as needed for dizziness.   Multiple Minerals-Vitamins (CALCIUM CITRATE PLUS/MAGNESIUM PO) Take by mouth.   OVER THE COUNTER MEDICATION Take 3 drops by mouth daily. Iodine Detoxadine   Pyridoxine HCl (B-6 PO) Take 1 tablet by mouth daily.   TURMERIC CURCUMIN PO Take 2 tablets by mouth daily.   VITAMIN D-VITAMIN K PO Take 1 tablet by mouth daily.   acetaminophen  (TYLENOL ) 650 MG CR tablet Take 650 mg by mouth every 8 (eight) hours as needed for pain. (Patient not taking: Reported on 02/10/2024)   No facility-administered encounter medications on file as of 02/10/2024.    Allergies (verified) Patient has no known allergies.   History: Past Medical History:  Diagnosis Date   Colon cancer screening 05/2018   Cologuard neg 05/2018 and 2023. Rpt 2026   GERD (gastroesophageal reflux disease)    History of kidney stones    Hypercholesterolemia 02/2017   LDL > 200. Pt chose TLC and LDL decreased to 130 in 2 months.  Rpt 05/2018: LDL 170s, HDL 80->Fram rx = 4.5%->continue TLC. Stable (10 yr frmhm cv  risk 6.5%) 05/2020 on TLC.   Left inguinal hernia 11/2016   Watchful waiting approach   Osteoporosis 11/04/2018   T score -3.2; recommended fosamax 10/2018. T score 09/2021 -3.3   Prediabetes 05/2019   2021 A1c 6.1%.  Feb 2022 a1c 5.8%.   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   Past Surgical History:  Procedure Laterality Date   COLONOSCOPY     X 2.  Most recent ? 20014?  No abnormals.   DEXA  11/04/2018   T score -3.2  spine   LAPAROSCOPY  1981   for possible infertility   TONSILLECTOMY  1960   Family History  Problem Relation Age of Onset   Arthritis Mother    Stroke Mother    Kidney disease Mother    Hyperlipidemia Father    Heart disease Father    Diabetes Father    Stroke Brother    Galactosemia Son    Lymphoma Paternal Grandmother    Diabetes Paternal Grandfather    Social History   Socioeconomic History   Marital status: Widowed    Spouse name: Not on file   Number of children: Not on file   Years of education: Not on file   Highest education level: Not on file  Occupational History   Not on file  Tobacco Use   Smoking status: Never   Smokeless tobacco: Never  Vaping Use   Vaping status: Never Used  Substance and Sexual Activity   Alcohol use: Yes    Alcohol/week: 3.0 standard drinks of alcohol    Types: 3 Glasses of wine per week   Drug use: No   Sexual activity: Not Currently  Other Topics Concern   Not on file  Social History Narrative   Widow (husba d cancer), 1 daughter and one son.   Educ: nursing school.   Occup: retired--hx of LPN work, sign Engineer, technical sales.  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   Moved from [place] 06/2016.   Alc: 4 glasses red wine per week.   No tobacco or drugs.   Social Drivers of Corporate investment banker Strain: Low Risk  (02/10/2024)   Overall Financial Resource Strain (CARDIA)    Difficulty of Paying Living Expenses: Not hard at all  Food Insecurity: No Food Insecurity (02/10/2024)   Hunger Vital Sign    Worried About Running Out of Food in the Last Year: Never true    Ran Out of Food in the Last Year: Never true  Transportation Needs: No Transportation Needs (02/10/2024)   PRAPARE - Administrator, Civil Service (Medical): No    Lack of Transportation (Non-Medical): No  Physical Activity: Insufficiently Active (02/10/2024)   Exercise Vital Sign    Days of Exercise per Week: 3 days    Minutes of Exercise per Session: 30  min  Stress: No Stress Concern Present (02/10/2024)   [employer] of Occupational Health - Occupational Stress Questionnaire    Feeling of Stress: Not at all  Social Connections: Moderately Isolated (02/10/2024)   Social Connection and Isolation Panel    Frequency of Communication with Friends and Family: More than three times a week    Frequency of Social Gatherings with Friends and Family: More than three times a week    Attends Religious Services: More than 4 times per year  Active Member of Clubs or Organizations: No    Attends Banker Meetings: Never    Marital Status: Widowed    Tobacco Counseling Counseling given: Not Answered   Clinical Intake:  Pre-visit preparation completed: Yes  Pain : No/denies pain     Diabetes: No  How often do you need to have someone help you when you read instructions, pamphlets, or other written materials from your doctor or pharmacy?: 1 - Never  Interpreter Needed?: No  Information entered by :: Mliss Graff LPN   Activities of Daily Living    02/10/2024    2:50 PM  In your present state of health, do you have any difficulty performing the following activities:  Hearing? 1  Vision? 0  Difficulty concentrating or making decisions? 0  Walking or climbing stairs? 0  Dressing or bathing? 0  Doing errands, shopping? 0  Preparing Food and eating ? N  Using the Toilet? N  In the past six months, have you accidently leaked urine? N  Do you have problems with loss of bowel control? N  Managing your Medications? N  Managing your Finances? N  Housekeeping or managing your Housekeeping? N    Patient Care Team: Candise Aleene DEL, MD as PCP - General (Family Medicine) Jesus Oliphant, MD as Consulting Physician (Otolaryngology)  Indicate any recent Medical Services you may have received from other than [facility] providers in the past year (date may be approximate).     Assessment:   This is a routine wellness  examination for [place].  Hearing/Vision screen Hearing Screening - Comments:: Some trouble hearing Does not wear hearing aids Vision Screening - Comments:: [facility] Up to date   Goals Addressed             This Visit's Progress    Patient Stated       Continue current lifestyle       Depression Screen    02/10/2024    2:47 PM 06/18/2023    8:02 AM 01/28/2023    2:56 PM 06/16/2022    7:58 AM 01/08/2022    2:39 PM 12/20/2020    1:24 PM 06/13/2020    8:22 AM  PHQ 2/9 Scores  PHQ - 2 Score 0 0 0 0 0 0 0  PHQ- 9 Score 1          Fall Risk    02/10/2024    2:50 PM 06/18/2023    8:01 AM 01/28/2023    3:11 PM 01/27/2023    7:13 PM 06/16/2022    7:58 AM  Fall Risk   Falls in the past year? 0 0 0 0 0  Number falls in past yr: 0 0 0 0 0  Injury with Fall? 0 0 0 0 0  Risk for fall due to :  No Fall Risks No Fall Risks  No Fall Risks  Follow up Falls evaluation completed;Education provided;Falls prevention discussed Falls evaluation completed Falls evaluation completed  Falls evaluation completed    MEDICARE RISK AT HOME: Medicare Risk at Home Any stairs in or around the home?: Yes If so, are there any without handrails?: No Home free of loose throw rugs in walkways, pet beds, electrical cords, etc?: Yes Adequate lighting in your home to reduce risk of falls?: Yes Life alert?: No Use of a cane, walker or w/c?: No Grab bars in the bathroom?: Yes Shower chair or bench in shower?: No Elevated toilet seat or a handicapped toilet?: No  TIMED UP AND  GO:  Was the test performed?  No    Cognitive Function:        02/10/2024    2:45 PM 01/28/2023    2:52 PM 01/08/2022    2:42 PM  6CIT Screen  What Year? 0 points 0 points 0 points  What month? 0 points 0 points 0 points  What time? 0 points 0 points 0 points  Count back from 20 0 points 0 points 0 points  Months in reverse 0 points 0 points 0 points  Repeat phrase 0 points 0 points 0 points  Total Score 0  points 0 points 0 points    Immunizations Immunization History  Administered Date(s) Administered   Tdap 02/23/2023    TDAP status: Up to date  Flu Vaccine status: Declined, Education has been provided regarding the importance of this vaccine but patient still declined. Advised may receive this vaccine at local pharmacy or Health Dept. Aware to provide a copy of the vaccination record if obtained from local pharmacy or Health Dept. Verbalized acceptance and understanding.  Pneumococcal vaccine status: Declined,  Education has been provided regarding the importance of this vaccine but patient still declined. Advised may receive this vaccine at local pharmacy or Health Dept. Aware to provide a copy of the vaccination record if obtained from local pharmacy or Health Dept. Verbalized acceptance and understanding.   Covid-19 vaccine status: Declined, Education has been provided regarding the importance of this vaccine but patient still declined. Advised may receive this vaccine at local pharmacy or Health Dept.or vaccine clinic. Aware to provide a copy of the vaccination record if obtained from local pharmacy or Health Dept. Verbalized acceptance and understanding.  Qualifies for Shingles Vaccine? Yes   Zostavax completed No   Shingrix Completed?: No.    Education has been provided regarding the importance of this vaccine. Patient has been advised to call insurance company to determine out of pocket expense if they have not yet received this vaccine. Advised may also receive vaccine at local pharmacy or Health Dept. Verbalized acceptance and understanding.  Screening Tests Health Maintenance  Topic Date Due   Zoster Vaccines- Shingrix (1 of 2) Never done   Mammogram  10/03/2022   COVID-19 Vaccine (1 - 2025-26 season) Never done   Pneumococcal Vaccine: 50+ Years (1 of 1 - PCV) 06/17/2024 (Originally 05/18/2002)   Influenza Vaccine  07/19/2024 (Originally 11/20/2023)   Fecal DNA (Cologuard)   07/01/2024   Medicare Annual Wellness (AWV)  02/09/2025   DEXA SCAN  Completed   Meningococcal B Vaccine  Aged Out   DTaP/Tdap/Td  Discontinued   Hepatitis C Screening  Discontinued    Health Maintenance  Health Maintenance Due  Topic Date Due   Zoster Vaccines- Shingrix (1 of 2) Never done   Mammogram  10/03/2022   COVID-19 Vaccine (1 - 2025-26 season) Never done    Colorectal cancer screening: Type of screening: Cologuard. Completed 2023. Repeat every 3 years  Mammogram status: Ordered  . Pt provided with contact info and advised to call to schedule appt.   Bone Density status: Ordered  . Pt provided with contact info and advised to call to schedule appt.  Lung Cancer Screening: (Low Dose CT Chest recommended if Age 8-80 years, 20 pack-year currently smoking OR have quit w/in 15years.) does not qualify.   Lung Cancer Screening Referral:   Additional Screening:  Hepatitis C Screening:  never done  Vision Screening: Recommended annual ophthalmology exams for early detection of glaucoma and other disorders  of the eye. Is the patient up to date with their annual eye exam?  Yes  Who is the provider or what is the name of the office in which the patient attends annual eye exams? [facility] If pt is not established with a provider, would they like to be referred to a provider to establish care? No .   Dental Screening: Recommended annual dental exams for proper oral hygiene    Community Resource Referral / Chronic Care Management: CRR required this visit?  No   CCM required this visit?  No     Plan:     I have personally reviewed and noted the following in the patient's chart:   Medical and social history Use of alcohol, tobacco or illicit drugs  Current medications and supplements including opioid prescriptions. Patient is not currently taking opioid prescriptions. Functional ability and status Nutritional status Physical activity Advanced directives List  of other physicians Hospitalizations, surgeries, and ER visits in previous 12 months Vitals Screenings to include cognitive, depression, and falls Referrals and appointments  In addition, I have reviewed and discussed with patient certain preventive protocols, quality metrics, and best practice recommendations. A written personalized care plan for preventive services as well as general preventive health recommendations were provided to patient.     Mliss Graff, LPN   [id number]   After Visit Summary: (MyChart) Due to this being a telephonic visit, the after visit summary with patients personalized plan was offered to patient via MyChart   Nurse Notes:

## 2024-05-10 NOTE — Progress Notes (Signed)
 Cabela Pacifico                                          MRN: [id number]   05/10/2024   The VBCI Quality Team Specialist reviewed this patient medical record for the purposes of chart review for care gap closure. The following were reviewed: chart review for care gap closure-breast cancer screening.    VBCI Quality Team

## 2024-05-22 ENCOUNTER — Other Ambulatory Visit: Payer: Self-pay

## 2024-05-22 ENCOUNTER — Encounter (HOSPITAL_COMMUNITY): Payer: Self-pay | Admitting: Pharmacy Technician

## 2024-05-22 ENCOUNTER — Emergency Department (HOSPITAL_COMMUNITY)

## 2024-05-22 ENCOUNTER — Emergency Department (HOSPITAL_COMMUNITY)
Admission: EM | Admit: 2024-05-22 | Discharge: 2024-05-22 | Disposition: A | Discharge status: Home / Self Care | Attending: Emergency Medicine | Admitting: Emergency Medicine

## 2024-05-22 DIAGNOSIS — I491 Atrial premature depolarization: Secondary | ICD-10-CM | POA: Insufficient documentation

## 2024-05-22 DIAGNOSIS — Z85038 Personal history of other malignant neoplasm of large intestine: Secondary | ICD-10-CM | POA: Insufficient documentation

## 2024-05-22 DIAGNOSIS — R790 Abnormal level of blood mineral: Secondary | ICD-10-CM | POA: Insufficient documentation

## 2024-05-22 DIAGNOSIS — Z79899 Other long term (current) drug therapy: Secondary | ICD-10-CM | POA: Diagnosis not present

## 2024-05-22 DIAGNOSIS — R002 Palpitations: Secondary | ICD-10-CM | POA: Diagnosis present

## 2024-05-22 LAB — CBC
HCT: 39.6 % (ref 36.0–46.0)
Hemoglobin: 13.1 g/dL (ref 12.0–15.0)
MCH: 31.7 pg (ref 26.0–34.0)
MCHC: 33.1 g/dL (ref 30.0–36.0)
MCV: 95.9 fL (ref 80.0–100.0)
Platelets: 207 10*3/uL (ref 150–400)
RBC: 4.13 MIL/uL (ref 3.87–5.11)
RDW: 12.9 % (ref 11.5–15.5)
WBC: 5.2 10*3/uL (ref 4.0–10.5)
nRBC: 0 % (ref 0.0–0.2)

## 2024-05-22 LAB — BASIC METABOLIC PANEL WITH GFR
Anion gap: 10 (ref 5–15)
BUN: 29 mg/dL — ABNORMAL HIGH (ref 8–23)
CO2: 23 mmol/L (ref 22–32)
Calcium: 9.1 mg/dL (ref 8.9–10.3)
Chloride: 107 mmol/L (ref 98–111)
Creatinine, Ser: 0.6 mg/dL (ref 0.44–1.00)
GFR, Estimated: >60 mL/min
Glucose, Bld: 101 mg/dL — ABNORMAL HIGH (ref 70–99)
Potassium: 4.5 mmol/L (ref 3.5–5.1)
Sodium: 140 mmol/L (ref 135–145)

## 2024-05-22 LAB — TSH: TSH: 2.49 u[IU]/mL (ref 0.350–4.500)

## 2024-05-22 MED ORDER — METOPROLOL TARTRATE 25 MG PO TABS: 25.0000 mg | ORAL_TABLET | Freq: Every day | ORAL | 0 refills | Status: AC | PRN

## 2024-05-22 NOTE — ED Provider Notes (Signed)
 " [place] Provider Note   CSN: [id number] Arrival date & time: 05/22/24  9082     Patient presents with: No chief complaint on file.   Katherine Villanueva is a 74 y.o. female.   HPI Patient presents with palpitations.  Yesterday and today.  States does feel as if there is some fast beats with it to but also irregular.  No lightheadedness or dizziness.  No fever.  No cough.  Not been around anyone sick.  States she has worn a monitor previously years ago but did not find anything.   Past Medical History:  Diagnosis Date   Colon cancer screening 05/2018   Cologuard neg 05/2018 and 2023. Rpt 2026   GERD (gastroesophageal reflux disease)    History of kidney stones    Hypercholesterolemia 02/2017   LDL > 200. Pt chose TLC and LDL decreased to 130 in 2 months.  Rpt 05/2018: LDL 170s, HDL 80->Fram rx = 4.5%->continue TLC. Stable (10 yr frmhm cv risk 6.5%) 05/2020 on TLC.   Left inguinal hernia 11/2016   Watchful waiting approach   Osteoporosis 11/04/2018   T score -3.2; recommended fosamax 10/2018. T score 09/2021 -3.3   Prediabetes 05/2019   2021 A1c 6.1%.  Feb 2022 a1c 5.8%.   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    Prior to Admission medications  Medication Sig Start Date End Date Taking? Authorizing Provider  metoprolol  tartrate (LOPRESSOR ) 25 MG tablet Take 1 tablet (25 mg total) by mouth daily as needed (Heavy palpitations). 05/22/24  Yes Patsey Lot, MD  acetaminophen  (TYLENOL ) 650 MG CR tablet Take 650 mg by mouth every 8 (eight) hours as needed for pain. Patient not taking: Reported on 02/10/2024    [provider]  ALFALFA PO Take 3 tablets by mouth 2 (two) times daily.    [provider]  Ascorbic Acid (VITAMIN C PO) Take 2 tablets by mouth 2 (two) times daily.    [provider]   BIOTIN PO Take 1 tablet by mouth daily.    [provider]  Cyanocobalamin (B-12 PO) Take 1 tablet by mouth daily.    [provider]  HORSE CHESTNUT PO Take 1 tablet by mouth 2 (two) times daily.    [provider]  Levomefolate Glucosamine (METHYLFOLATE PO) Take 1 tablet by mouth daily.    [provider]  meclizine  (ANTIVERT ) 25 MG tablet Take 1 tablet (25 mg total) by mouth 3 (three) times daily as needed for dizziness. 04/11/20   Robinson, Jordan N, PA-C  Multiple Minerals-Vitamins (CALCIUM CITRATE PLUS/MAGNESIUM PO) Take by mouth.    [provider]  OVER THE COUNTER MEDICATION Take 3 drops by mouth daily. Iodine Detoxadine    [provider]  Pyridoxine HCl (B-6 PO) Take 1 tablet by mouth daily.    [provider]  TURMERIC CURCUMIN PO Take 2 tablets by mouth daily.    [provider]  VITAMIN D-VITAMIN K PO Take 1 tablet by mouth daily.    [provider]    Allergies: Patient has no known allergies.    Review of Systems  Updated Vital Signs BP (!) 147/116   Pulse 74   Temp 99 F (37.2 C) (Oral)   Resp 20   LMP  (LMP Unknown)   SpO2  100%   Physical Exam Vitals and nursing note reviewed.  Cardiovascular:     Rate and Rhythm: Regular rhythm.     Heart sounds: No murmur heard. Pulmonary:     Effort: Pulmonary effort is normal.  Abdominal:     Tenderness: There is no abdominal tenderness.  Skin:    General: Skin is warm.  Neurological:     Mental Status: She is alert and oriented to person, place, and time.     (all labs ordered are listed, but only abnormal results are displayed) Labs Reviewed  BASIC METABOLIC PANEL WITH GFR - Abnormal; Notable for the following components:      Result Value   Glucose, Bld 101 (*)    BUN 29 (*)    All other components within normal limits  CBC  TSH    EKG: EKG Interpretation Date/Time:  "Sunday May 22 2024 09:48:48 EST Ventricular Rate:   89 PR Interval:  125 QRS Duration:  103 QT Interval:  386 QTC Calculation: 470 R Axis:   70  Text Interpretation: Sinus rhythm Probable left atrial enlargement RSR' in V1 or V2, probably normal variant Borderline repolarization abnormality Artifact in lead(s) II III aVR aVL aVF V3 V4 V5 V6 Confirmed by Chaston Bradburn ([id number]) on 05/22/2024 10:05:44 AM  Radiology: DG Chest Portable 1 View Result Date: 05/22/2024 CLINICAL DATA:  Palpitations. EXAM: PORTABLE CHEST 1 VIEW COMPARISON:  None Available. FINDINGS: The heart size and mediastinal contours are within normal limits. The lungs are hyperinflated. No acute infiltrate, pleural effusion or pneumothorax is identified. The visualized skeletal structures are unremarkable. IMPRESSION: No active cardiopulmonary disease. Electronically Signed   By: Thaddeus  Houston M.D.   On: 05/22/2024 10:42     Procedures   Medications Ordered in the ED - No data to display                                  Medical Decision Making Amount and/or Complexity of Data Reviewed Labs: ordered. Radiology: ordered.  Risk Prescription drug management.   Patient with palpitations.  EKG reassuring.  PVCs and PACs probably most likely but will monitor for arrhythmia while here.  Will get basic blood work including TSH.  Will also get chest x-ray to evaluate for causes such as cardiomegaly  Blood work overall reassuring.  BUN slightly elevated.  May be a small component of dehydration.  However on initial monitoring no arrhythmia.  However patient states is worse when she lay down.  Patient was laid down and did have a few PACs.  Potentially could be the cause.  Appears stable for discharge home with cardiology follow-up.  Will have a prescription for as needed Lopressor 25 mg to take if she is feeling a lot of palpitations.  Appears stable for discharge home however.  TSH still pending.     Final diagnoses:  PAC (premature atrial contraction)    ED Discharge  Orders          Ordered    Ambulatory referral to Cardiology       Comments: If you have not heard from the Cardiology office within the next 72 hours please call [phone].   05/22/24 1108    metoprolol tartrate (LOPRESSOR) 25 MG tablet  Daily PRN        02" /01/26 1108               Patsey Lot, MD 05/22/24 1113  "

## 2024-05-22 NOTE — Discharge Instructions (Addendum)
 You have had a few premature atrial contractions while in the ER.  You have been given a prescription for metoprolol .  If you are feeling a lot of beats you can take a dose of it to see if it will help suppress them.  Follow-up with cardiology.

## 2024-05-22 NOTE — ED Triage Notes (Signed)
 Pt here with reports of feeling palpitations yesterday and throughout the night. Denies chest pain or shob. Denies any other complaints.

## 2024-05-30 ENCOUNTER — Ambulatory Visit: Admitting: Physician Assistant

## 2024-06-17 ENCOUNTER — Encounter: Payer: Medicare HMO | Admitting: Family Medicine

## 2025-02-15 ENCOUNTER — Ambulatory Visit
